# Patient Record
Sex: Male | Born: 1935 | Race: White | Hispanic: No | Smoking: Never smoker
Health system: Southern US, Community
[De-identification: ages and names within clinical notes are randomized; demographics above are authoritative.]

## PROBLEM LIST (undated history)

## (undated) DIAGNOSIS — N400 Enlarged prostate without lower urinary tract symptoms: Secondary | ICD-10-CM

## (undated) DIAGNOSIS — M810 Age-related osteoporosis without current pathological fracture: Secondary | ICD-10-CM

## (undated) DIAGNOSIS — D509 Iron deficiency anemia, unspecified: Secondary | ICD-10-CM

## (undated) DIAGNOSIS — E119 Type 2 diabetes mellitus without complications: Secondary | ICD-10-CM

## (undated) DIAGNOSIS — F32A Depression, unspecified: Secondary | ICD-10-CM

## (undated) DIAGNOSIS — I639 Cerebral infarction, unspecified: Secondary | ICD-10-CM

## (undated) DIAGNOSIS — K5900 Constipation, unspecified: Secondary | ICD-10-CM

## (undated) DIAGNOSIS — N189 Chronic kidney disease, unspecified: Secondary | ICD-10-CM

## (undated) DIAGNOSIS — F329 Major depressive disorder, single episode, unspecified: Secondary | ICD-10-CM

## (undated) DIAGNOSIS — I10 Essential (primary) hypertension: Secondary | ICD-10-CM

## (undated) DIAGNOSIS — K219 Gastro-esophageal reflux disease without esophagitis: Secondary | ICD-10-CM

## (undated) DIAGNOSIS — R609 Edema, unspecified: Secondary | ICD-10-CM

## (undated) DIAGNOSIS — E876 Hypokalemia: Secondary | ICD-10-CM

## (undated) DIAGNOSIS — R1310 Dysphagia, unspecified: Secondary | ICD-10-CM

## (undated) DIAGNOSIS — N390 Urinary tract infection, site not specified: Secondary | ICD-10-CM

## (undated) DIAGNOSIS — L8990 Pressure ulcer of unspecified site, unspecified stage: Secondary | ICD-10-CM

## (undated) HISTORY — PX: SUPRAPUBIC CATHETER INSERTION: SUR719

---

## 2016-11-11 DIAGNOSIS — E119 Type 2 diabetes mellitus without complications: Secondary | ICD-10-CM | POA: Insufficient documentation

## 2016-11-11 DIAGNOSIS — N189 Chronic kidney disease, unspecified: Secondary | ICD-10-CM | POA: Insufficient documentation

## 2016-11-11 DIAGNOSIS — Z794 Long term (current) use of insulin: Secondary | ICD-10-CM | POA: Insufficient documentation

## 2016-11-11 DIAGNOSIS — D72820 Lymphocytosis (symptomatic): Secondary | ICD-10-CM | POA: Insufficient documentation

## 2017-06-08 ENCOUNTER — Other Ambulatory Visit: Payer: Self-pay

## 2017-06-08 ENCOUNTER — Inpatient Hospital Stay
Admission: EM | Admit: 2017-06-08 | Discharge: 2017-06-12 | DRG: 698 | Disposition: A | Discharge status: Skilled Nursing Facility | Payer: Medicare Other | Attending: Specialist | Admitting: Specialist

## 2017-06-08 ENCOUNTER — Inpatient Hospital Stay: Payer: Medicare Other

## 2017-06-08 DIAGNOSIS — N183 Chronic kidney disease, stage 3 (moderate): Secondary | ICD-10-CM | POA: Diagnosis present

## 2017-06-08 DIAGNOSIS — M810 Age-related osteoporosis without current pathological fracture: Secondary | ICD-10-CM | POA: Diagnosis present

## 2017-06-08 DIAGNOSIS — Z7982 Long term (current) use of aspirin: Secondary | ICD-10-CM

## 2017-06-08 DIAGNOSIS — R338 Other retention of urine: Secondary | ICD-10-CM | POA: Diagnosis present

## 2017-06-08 DIAGNOSIS — F0390 Unspecified dementia without behavioral disturbance: Secondary | ICD-10-CM | POA: Diagnosis present

## 2017-06-08 DIAGNOSIS — R532 Functional quadriplegia: Secondary | ICD-10-CM | POA: Diagnosis present

## 2017-06-08 DIAGNOSIS — L89612 Pressure ulcer of right heel, stage 2: Secondary | ICD-10-CM | POA: Diagnosis present

## 2017-06-08 DIAGNOSIS — Y846 Urinary catheterization as the cause of abnormal reaction of the patient, or of later complication, without mention of misadventure at the time of the procedure: Secondary | ICD-10-CM | POA: Diagnosis present

## 2017-06-08 DIAGNOSIS — E1122 Type 2 diabetes mellitus with diabetic chronic kidney disease: Secondary | ICD-10-CM | POA: Diagnosis present

## 2017-06-08 DIAGNOSIS — Z79899 Other long term (current) drug therapy: Secondary | ICD-10-CM

## 2017-06-08 DIAGNOSIS — Z1624 Resistance to multiple antibiotics: Secondary | ICD-10-CM | POA: Diagnosis present

## 2017-06-08 DIAGNOSIS — B961 Klebsiella pneumoniae [K. pneumoniae] as the cause of diseases classified elsewhere: Secondary | ICD-10-CM | POA: Diagnosis present

## 2017-06-08 DIAGNOSIS — Z8673 Personal history of transient ischemic attack (TIA), and cerebral infarction without residual deficits: Secondary | ICD-10-CM

## 2017-06-08 DIAGNOSIS — Z794 Long term (current) use of insulin: Secondary | ICD-10-CM

## 2017-06-08 DIAGNOSIS — Z87440 Personal history of urinary (tract) infections: Secondary | ICD-10-CM

## 2017-06-08 DIAGNOSIS — I129 Hypertensive chronic kidney disease with stage 1 through stage 4 chronic kidney disease, or unspecified chronic kidney disease: Secondary | ICD-10-CM | POA: Diagnosis present

## 2017-06-08 DIAGNOSIS — N179 Acute kidney failure, unspecified: Secondary | ICD-10-CM | POA: Diagnosis present

## 2017-06-08 DIAGNOSIS — N401 Enlarged prostate with lower urinary tract symptoms: Secondary | ICD-10-CM | POA: Diagnosis present

## 2017-06-08 DIAGNOSIS — L89152 Pressure ulcer of sacral region, stage 2: Secondary | ICD-10-CM | POA: Diagnosis present

## 2017-06-08 DIAGNOSIS — Z8249 Family history of ischemic heart disease and other diseases of the circulatory system: Secondary | ICD-10-CM

## 2017-06-08 DIAGNOSIS — T83511A Infection and inflammatory reaction due to indwelling urethral catheter, initial encounter: Secondary | ICD-10-CM | POA: Diagnosis present

## 2017-06-08 DIAGNOSIS — L8990 Pressure ulcer of unspecified site, unspecified stage: Secondary | ICD-10-CM

## 2017-06-08 DIAGNOSIS — N390 Urinary tract infection, site not specified: Secondary | ICD-10-CM | POA: Diagnosis present

## 2017-06-08 DIAGNOSIS — I959 Hypotension, unspecified: Secondary | ICD-10-CM | POA: Diagnosis present

## 2017-06-08 DIAGNOSIS — Z8619 Personal history of other infectious and parasitic diseases: Secondary | ICD-10-CM

## 2017-06-08 DIAGNOSIS — L89622 Pressure ulcer of left heel, stage 2: Secondary | ICD-10-CM | POA: Diagnosis present

## 2017-06-08 DIAGNOSIS — K219 Gastro-esophageal reflux disease without esophagitis: Secondary | ICD-10-CM | POA: Diagnosis present

## 2017-06-08 DIAGNOSIS — R05 Cough: Secondary | ICD-10-CM | POA: Diagnosis not present

## 2017-06-08 DIAGNOSIS — Z66 Do not resuscitate: Secondary | ICD-10-CM | POA: Diagnosis present

## 2017-06-08 DIAGNOSIS — R059 Cough, unspecified: Secondary | ICD-10-CM

## 2017-06-08 DIAGNOSIS — Z1612 Extended spectrum beta lactamase (ESBL) resistance: Secondary | ICD-10-CM | POA: Diagnosis present

## 2017-06-08 DIAGNOSIS — R310 Gross hematuria: Secondary | ICD-10-CM | POA: Diagnosis present

## 2017-06-08 DIAGNOSIS — E860 Dehydration: Secondary | ICD-10-CM | POA: Diagnosis present

## 2017-06-08 HISTORY — DX: Benign prostatic hyperplasia without lower urinary tract symptoms: N40.0

## 2017-06-08 HISTORY — DX: Type 2 diabetes mellitus without complications: E11.9

## 2017-06-08 HISTORY — DX: Gastro-esophageal reflux disease without esophagitis: K21.9

## 2017-06-08 HISTORY — DX: Depression, unspecified: F32.A

## 2017-06-08 HISTORY — DX: Age-related osteoporosis without current pathological fracture: M81.0

## 2017-06-08 HISTORY — DX: Major depressive disorder, single episode, unspecified: F32.9

## 2017-06-08 LAB — BASIC METABOLIC PANEL
Anion gap: 9 (ref 5–15)
BUN: 48 mg/dL — ABNORMAL HIGH (ref 6–20)
CALCIUM: 8 mg/dL — AB (ref 8.9–10.3)
CO2: 26 mmol/L (ref 22–32)
CREATININE: 2.02 mg/dL — AB (ref 0.61–1.24)
Chloride: 100 mmol/L — ABNORMAL LOW (ref 101–111)
GFR calc non Af Amer: 28 mL/min — ABNORMAL LOW (ref >60)
GFR, EST AFRICAN AMERICAN: 33 mL/min — AB (ref >60)
Glucose, Bld: 106 mg/dL — ABNORMAL HIGH (ref 65–99)
Potassium: 4.3 mmol/L (ref 3.5–5.1)
SODIUM: 135 mmol/L (ref 135–145)

## 2017-06-08 LAB — URINALYSIS, COMPLETE (UACMP) WITH MICROSCOPIC
Bilirubin Urine: NEGATIVE
GLUCOSE, UA: NEGATIVE mg/dL
KETONES UR: NEGATIVE mg/dL
NITRITE: POSITIVE — AB
PH: 5 (ref 5.0–8.0)
Protein, ur: 100 mg/dL — AB
SPECIFIC GRAVITY, URINE: 1.009 (ref 1.005–1.030)

## 2017-06-08 LAB — CBC WITH DIFFERENTIAL/PLATELET
BASOS PCT: 0 %
Basophils Absolute: 0.1 10*3/uL (ref 0–0.1)
EOS ABS: 0.2 10*3/uL (ref 0–0.7)
Eosinophils Relative: 2 %
HCT: 32.7 % — ABNORMAL LOW (ref 40.0–52.0)
Hemoglobin: 10.8 g/dL — ABNORMAL LOW (ref 13.0–18.0)
Lymphocytes Relative: 29 %
Lymphs Abs: 3.5 10*3/uL (ref 1.0–3.6)
MCH: 28.2 pg (ref 26.0–34.0)
MCHC: 32.9 g/dL (ref 32.0–36.0)
MCV: 85.8 fL (ref 80.0–100.0)
Monocytes Absolute: 1.1 10*3/uL — ABNORMAL HIGH (ref 0.2–1.0)
Monocytes Relative: 9 %
Neutro Abs: 7.3 10*3/uL — ABNORMAL HIGH (ref 1.4–6.5)
Neutrophils Relative %: 60 %
Platelets: 216 10*3/uL (ref 150–440)
RBC: 3.82 MIL/uL — AB (ref 4.40–5.90)
RDW: 17.9 % — ABNORMAL HIGH (ref 11.5–14.5)
WBC: 12.2 10*3/uL — AB (ref 3.8–10.6)

## 2017-06-08 LAB — PROTIME-INR
INR: 0.99
PROTHROMBIN TIME: 13 s (ref 11.4–15.2)

## 2017-06-08 LAB — GLUCOSE, CAPILLARY: GLUCOSE-CAPILLARY: 87 mg/dL (ref 65–99)

## 2017-06-08 LAB — CK: Total CK: 30 U/L — ABNORMAL LOW (ref 49–397)

## 2017-06-08 MED ORDER — ACETAMINOPHEN 650 MG RE SUPP: 650.0000 mg | Freq: Four times a day (QID) | RECTAL | Status: DC | PRN

## 2017-06-08 MED ORDER — FERROUS SULFATE 325 (65 FE) MG PO TABS
325.0000 mg | ORAL_TABLET | ORAL | Status: DC
  Administered 2017-06-09 – 2017-06-12 (×4): 325 mg via ORAL
  Filled 2017-06-08 (×4): qty 1

## 2017-06-08 MED ORDER — CHOLECALCIFEROL 10 MCG (400 UNIT) PO TABS
400.0000 [IU] | ORAL_TABLET | Freq: Three times a day (TID) | ORAL | Status: DC
  Administered 2017-06-08 – 2017-06-12 (×11): 400 [IU] via ORAL
  Filled 2017-06-08 (×14): qty 1

## 2017-06-08 MED ORDER — TRAZODONE HCL 50 MG PO TABS
50.0000 mg | ORAL_TABLET | Freq: Every evening | ORAL | Status: DC
  Administered 2017-06-08 – 2017-06-11 (×4): 50 mg via ORAL
  Filled 2017-06-08 (×4): qty 1

## 2017-06-08 MED ORDER — SODIUM CHLORIDE 0.9 % IV SOLN
1.0000 g | Freq: Two times a day (BID) | INTRAVENOUS | Status: DC
Start: 2017-06-08 — End: 2017-06-11
  Administered 2017-06-08 – 2017-06-11 (×6): 1 g via INTRAVENOUS
  Filled 2017-06-08 (×7): qty 1

## 2017-06-08 MED ORDER — HYDROCODONE-ACETAMINOPHEN 5-325 MG PO TABS
1.0000 | ORAL_TABLET | ORAL | Status: DC | PRN
  Administered 2017-06-09 – 2017-06-10 (×3): 1 via ORAL
  Filled 2017-06-08 (×3): qty 1

## 2017-06-08 MED ORDER — POLYETHYLENE GLYCOL 3350 17 G PO PACK: 17.0000 g | PACK | Freq: Every day | ORAL | Status: DC | PRN

## 2017-06-08 MED ORDER — QUETIAPINE FUMARATE 25 MG PO TABS
12.5000 mg | ORAL_TABLET | Freq: Two times a day (BID) | ORAL | Status: DC
  Administered 2017-06-08 – 2017-06-12 (×8): 12.5 mg via ORAL
  Filled 2017-06-08 (×8): qty 1

## 2017-06-08 MED ORDER — GUAIFENESIN 100 MG/5ML PO SOLN
10.0000 mL | ORAL | Status: DC | PRN
  Filled 2017-06-08: qty 10

## 2017-06-08 MED ORDER — METOPROLOL SUCCINATE ER 50 MG PO TB24
50.0000 mg | ORAL_TABLET | Freq: Every day | ORAL | Status: DC
  Administered 2017-06-10: 50 mg via ORAL
  Filled 2017-06-08 (×2): qty 1

## 2017-06-08 MED ORDER — SODIUM CHLORIDE 0.9 % IV SOLN
INTRAVENOUS | Status: DC
  Administered 2017-06-08 – 2017-06-11 (×4): via INTRAVENOUS

## 2017-06-08 MED ORDER — SODIUM CHLORIDE 0.9 % IV SOLN: 500.0000 mg | Freq: Three times a day (TID) | INTRAVENOUS | Status: DC

## 2017-06-08 MED ORDER — LACTATED RINGERS IV SOLN: INTRAVENOUS | Status: DC

## 2017-06-08 MED ORDER — PIPERACILLIN-TAZOBACTAM 3.375 G IVPB 30 MIN
3.3750 g | Freq: Once | INTRAVENOUS | Status: AC
  Administered 2017-06-08: 3.375 g via INTRAVENOUS
  Filled 2017-06-08: qty 50

## 2017-06-08 MED ORDER — BISACODYL 10 MG RE SUPP
10.0000 mg | Freq: Every day | RECTAL | Status: DC | PRN
Start: 2017-06-08 — End: 2017-06-12

## 2017-06-08 MED ORDER — PANTOPRAZOLE SODIUM 40 MG PO TBEC
40.0000 mg | DELAYED_RELEASE_TABLET | ORAL | Status: DC
  Administered 2017-06-09 – 2017-06-12 (×4): 40 mg via ORAL
  Filled 2017-06-08 (×4): qty 1

## 2017-06-08 MED ORDER — POTASSIUM CHLORIDE CRYS ER 10 MEQ PO TBCR
10.0000 meq | EXTENDED_RELEASE_TABLET | Freq: Every day | ORAL | Status: DC
  Administered 2017-06-09 – 2017-06-12 (×4): 10 meq via ORAL
  Filled 2017-06-08 (×4): qty 1

## 2017-06-08 MED ORDER — ACETAMINOPHEN 325 MG PO TABS: 650.0000 mg | ORAL_TABLET | Freq: Four times a day (QID) | ORAL | Status: DC | PRN

## 2017-06-08 MED ORDER — ONDANSETRON HCL 4 MG PO TABS: 4.0000 mg | ORAL_TABLET | Freq: Four times a day (QID) | ORAL | Status: DC | PRN

## 2017-06-08 MED ORDER — ONDANSETRON HCL 4 MG/2ML IJ SOLN: 4.0000 mg | Freq: Four times a day (QID) | INTRAMUSCULAR | Status: DC | PRN

## 2017-06-08 MED ORDER — ASPIRIN 81 MG PO CHEW
81.0000 mg | CHEWABLE_TABLET | Freq: Every day | ORAL | Status: DC
  Administered 2017-06-09: 81 mg via ORAL
  Filled 2017-06-08 (×2): qty 1

## 2017-06-08 MED ORDER — INSULIN GLARGINE 100 UNIT/ML ~~LOC~~ SOLN
20.0000 [IU] | Freq: Every day | SUBCUTANEOUS | Status: DC
  Administered 2017-06-09 – 2017-06-10 (×2): 20 [IU] via SUBCUTANEOUS
  Filled 2017-06-08 (×4): qty 0.2

## 2017-06-08 MED ORDER — FLEET ENEMA 7-19 GM/118ML RE ENEM: 1.0000 | ENEMA | Freq: Once | RECTAL | Status: DC | PRN

## 2017-06-08 MED ORDER — TAMSULOSIN HCL 0.4 MG PO CAPS
0.4000 mg | ORAL_CAPSULE | ORAL | Status: DC
  Administered 2017-06-09 – 2017-06-12 (×4): 0.4 mg via ORAL
  Filled 2017-06-08 (×4): qty 1

## 2017-06-08 MED ORDER — DOCUSATE SODIUM 100 MG PO CAPS
100.0000 mg | ORAL_CAPSULE | Freq: Two times a day (BID) | ORAL | Status: DC
  Administered 2017-06-08 – 2017-06-12 (×8): 100 mg via ORAL
  Filled 2017-06-08 (×8): qty 1

## 2017-06-08 MED ORDER — CITALOPRAM HYDROBROMIDE 20 MG PO TABS
20.0000 mg | ORAL_TABLET | Freq: Every day | ORAL | Status: DC
  Administered 2017-06-09 – 2017-06-12 (×4): 20 mg via ORAL
  Filled 2017-06-08 (×4): qty 1

## 2017-06-08 MED ORDER — SODIUM CHLORIDE 0.9 % IV BOLUS
500.0000 mL | Freq: Once | INTRAVENOUS | Status: AC
  Administered 2017-06-08: 500 mL via INTRAVENOUS

## 2017-06-08 MED ORDER — CALCIUM CARBONATE ANTACID 500 MG PO CHEW
1.0000 | CHEWABLE_TABLET | Freq: Every day | ORAL | Status: DC
  Administered 2017-06-09 – 2017-06-12 (×4): 200 mg via ORAL
  Filled 2017-06-08 (×4): qty 1

## 2017-06-08 MED ORDER — DUTASTERIDE 0.5 MG PO CAPS
0.5000 mg | ORAL_CAPSULE | Freq: Every day | ORAL | Status: DC
  Administered 2017-06-09 – 2017-06-12 (×4): 0.5 mg via ORAL
  Filled 2017-06-08 (×4): qty 1

## 2017-06-08 NOTE — ED Provider Notes (Signed)
[facility] Emergency Department Provider Note ____________________________________________   First MD Initiated Contact with Patient 06/08/17 1610     (approximate)  I have reviewed the triage vital signs and the nursing notes.   HISTORY  Chief Complaint Hematuria  Level 5 caveat: History of present illness limited due to dementia  HPI Dustin Hicks is a 84 y.o. male with PMH as noted below who presents with apparent hematuria, acute onset over the last several days, with no significant associated symptoms.  The daughter states that the patient previously had dark/cloudy urine, and was treated with Augmentin for 2 weeks, finishing a few days ago.  She states that when she went to the nursing facility that she noticed the dark urine.  She states that his mental status is at his baseline, and the patient himself denies any acute symptoms.  The daughter also reports that the patient has pressure sores on bilateral heels and sacral area which are being addressed at the nursing home, but she is concerned that they might not be being cared for appropriately.   History reviewed. No pertinent past medical history.  There are no active problems to display for this patient.   History reviewed. No pertinent surgical history.  Prior to Admission medications   Medication Sig Start Date End Date Taking? Authorizing Provider  acetaminophen (TYLENOL) 325 MG tablet Take 650 mg by mouth every 4 (four) hours as needed for mild pain or fever.   Yes [provider]  acetaminophen (TYLENOL) 500 MG tablet Take 1,000 mg by mouth 3 (three) times daily.   Yes [provider]  aspirin 81 MG chewable tablet Chew 81 mg by mouth daily.   Yes [provider]  calcium carbonate (TUMS - DOSED IN MG ELEMENTAL CALCIUM) 500 MG chewable tablet Chew 1 tablet by mouth daily.   Yes [provider]  cholecalciferol (VITAMIN D) 400 units TABS tablet Take  400 Units by mouth 3 (three) times daily.   Yes [provider]  citalopram (CELEXA) 20 MG tablet Take 20 mg by mouth daily.   Yes [provider]  dutasteride (AVODART) 0.5 MG capsule Take 0.5 mg by mouth daily.   Yes [provider]  ferrous sulfate 325 (65 FE) MG tablet Take 325 mg by mouth every morning.   Yes [provider]  furosemide (LASIX) 20 MG tablet Take 20 mg by mouth every morning.   Yes [provider]  guaiFENesin (ROBITUSSIN) 100 MG/5ML SOLN Take 10 mLs by mouth every 4 (four) hours as needed for cough or to loosen phlegm.   Yes [provider]  insulin glargine (LANTUS) 100 UNIT/ML injection Inject 20 Units into the skin at bedtime.   Yes [provider]  metoprolol succinate (TOPROL-XL) 50 MG 24 hr tablet Take 50 mg by mouth daily. Take with or immediately following a meal.   Yes [provider]  pantoprazole (PROTONIX) 40 MG tablet Take 40 mg by mouth every morning.   Yes [provider]  potassium chloride (K-DUR) 10 MEQ tablet Take 10 mEq by mouth daily.   Yes [provider]  QUEtiapine (SEROQUEL) 25 MG tablet Take 12.5 mg by mouth 2 (two) times daily.   Yes [provider]  tamsulosin (FLOMAX) 0.4 MG CAPS capsule Take 0.4 mg by mouth every morning.   Yes [provider]  traMADol (ULTRAM) 50 MG tablet Take 50 mg by mouth 3 (three) times daily as needed for moderate pain.  Yes [provider]  traZODone (DESYREL) 50 MG tablet Take 50 mg by mouth every evening.   Yes [provider]    Allergies Patient has no known allergies.  No family history on file.  Social History Social History   Tobacco Use  . Smoking status: Never Smoker  . Smokeless tobacco: Never Used  Substance Use Topics  . Alcohol use: Never    Frequency: Never  . Drug use: Never    Review of Systems Level 5 caveat: Unable to obtain review of systems due to  dementia    ____________________________________________   PHYSICAL EXAM:  VITAL SIGNS: ED Triage Vitals  Enc Vitals Group     BP 06/08/17 1548 108/60     Pulse Rate 06/08/17 1548 60     Resp 06/08/17 1548 15     Temp 06/08/17 1548 97.8 F (36.6 C)     Temp Source 06/08/17 1548 Axillary     SpO2 06/08/17 1548 98 %     Weight 06/08/17 1549 154 lb (69.9 kg)     Height 06/08/17 1549 5\' 10"  (1.778 m)     Head Circumference --      Peak Flow --      Pain Score --      Pain Loc --      Pain Edu? --      Excl. in GC? --     Constitutional: Alert, oriented x2.  Comfortable appearing. Eyes: Conjunctivae are normal.  Head: Atraumatic. Nose: No congestion/rhinnorhea. Mouth/Throat: Mucous membranes are dry.   Neck: Normal range of motion.  Cardiovascular: Normal rate, regular rhythm. Grossly normal heart sounds.  Good peripheral circulation. Respiratory: Normal respiratory effort.  No retractions. Lungs CTAB. Gastrointestinal: Soft and nontender. No distention.  Genitourinary: No flank tenderness. Musculoskeletal: No lower extremity edema.  Extremities warm and well perfused.  Neurologic:  Normal speech and language. No gross focal neurologic deficits are appreciated.  Skin:  Skin is warm and dry. No rash noted. Psychiatric: Mood and affect are normal. Speech and behavior are normal.  ____________________________________________   LABS (all labs ordered are listed, but only abnormal results are displayed)  Labs Reviewed  BASIC METABOLIC PANEL - Abnormal; Notable for the following components:      Result Value   Chloride 100 (*)    Glucose, Bld 106 (*)    BUN 48 (*)    Creatinine, Ser 2.02 (*)    Calcium 8.0 (*)    GFR calc non Af Amer 28 (*)    GFR calc Af Amer 33 (*)    All other components within normal limits  CBC WITH DIFFERENTIAL/PLATELET - Abnormal; Notable for the following components:   WBC 12.2 (*)    RBC 3.82 (*)    Hemoglobin 10.8 (*)    HCT 32.7 (*)     RDW 17.9 (*)    Neutro Abs 7.3 (*)    Monocytes Absolute 1.1 (*)    All other components within normal limits  URINALYSIS, COMPLETE (UACMP) WITH MICROSCOPIC - Abnormal; Notable for the following components:   Color, Urine AMBER (*)    APPearance TURBID (*)    Hgb urine dipstick LARGE (*)    Protein, ur 100 (*)    Nitrite POSITIVE (*)    Leukocytes, UA LARGE (*)    Bacteria, UA MANY (*)    All other components within normal limits  CK - Abnormal; Notable for the following components:   Total CK 30 (*)  All other components within normal limits  URINE CULTURE  PROTIME-INR   ____________________________________________  EKG  ____________________________________________  RADIOLOGY    ____________________________________________   PROCEDURES  Procedure(s) performed: No  Procedures  Critical Care performed: No ____________________________________________   INITIAL IMPRESSION / ASSESSMENT AND PLAN / ED COURSE  Pertinent labs & imaging results that were available during my care of the patient were reviewed by me and considered in my medical decision making (see chart for details).  84 year old male with PMH as noted above and status post recent treatment for apparent UTI presents with recurrent dark urine, noticed today.  The patient's daughter also reports that he has sacral and heel pressure sores which she was concerned were not being adequately treated at the nursing facility, although she says that they have improved over the last few weeks.  On exam, the patient is comfortable appearing and alert.  Vital signs are normal.  The remainder the exam is as described above.  The urine in the bag is a dark brown color.  Differential includes UTI, hematuria from other source, dehydration or other metabolic abnormality, or rhabdomyolysis.  Plan: IV fluids, lab work-up, UA, and reassess.    ----------------------------------------- 7:24 PM on  06/08/2017 -----------------------------------------  Patient's UA is grossly abnormal with WBCs and RBCs, nitrates, and leukocyte esterase.  Although some of this may be due to chronic colonization related to his catheter, given the acute change in the appearance of his urine as well as his elevated RBC count this is more consistent with resistant infection.  Per the daughter, the patient had not even finished the current antibiotic course when the urine became cloudy and dark over the last few days.  Unfortunately, although the patient follows at [facility], his [facility] records are not crossing over via care everywhere, and I am not able to obtain his past records.  Per the daughter, his most recent creatinine was either 1.6-1.9, so his creatinine today is likely not a significant change.  Daughter states that there may been a history of ESBL.  I have given a dose of broad-spectrum IV antibiotic.  Given the concern for possible resistant UTI status post multiple courses of outpatient oral treatment, I feel that it would be safest to admit the patient for IV antibiotics until his cultures can result.  Daughter agrees with this plan.  I signed the patient out for admission to the hospitalist Dr. Katheren Shams.  ____________________________________________   FINAL CLINICAL IMPRESSION(S) / ED DIAGNOSES  Final diagnoses:  Lower urinary tract infectious disease      NEW MEDICATIONS STARTED DURING THIS VISIT:  New Prescriptions   No medications on file     Note:  This document was prepared using Dragon voice recognition software and may include unintentional dictation errors.    Dionne Bucy, MD 06/08/17 [phone]

## 2017-06-08 NOTE — Progress Notes (Signed)
Pharmacy Antibiotic Note  Dustin Hicks is a 84 y.o. male admitted on 06/08/2017 with esbl uti.  Pharmacy has been consulted for merrem dosing.  Plan: merrem 1gm iv q12h   Height: 5\' 10"  (177.8 cm) Weight: 154 lb (69.9 kg) IBW/kg (Calculated) : 73  Temp (24hrs), Avg:97.8 F (36.6 C), Min:97.8 F (36.6 C), Max:97.8 F (36.6 C)  Recent Labs  Lab 06/08/17 1633  WBC 12.2*  CREATININE 2.02*    Estimated Creatinine Clearance: 26 mL/min (A) (by C-G formula based on SCr of 2.02 mg/dL (H)).    No Known Allergies  Antimicrobials this admission: Anti-infectives (From admission, onward)   Start     Dose/Rate Route Frequency Ordered Stop   06/08/17 2200  meropenem (MERREM) 1 g in sodium chloride 0.9 % 100 mL IVPB     1 g 200 mL/hr over 30 Minutes Intravenous Every 12 hours 06/08/17 1959     06/08/17 1915  piperacillin-tazobactam (ZOSYN) IVPB 3.375 g     3.375 g 100 mL/hr over 30 Minutes Intravenous  Once 06/08/17 1911         Microbiology results: No results found for this or any previous visit (from the past 240 hour(s)).   Thank you for allowing pharmacy to be a part of this patient's care.  Gerre PebblesGarrett Cyndee Giammarco 06/08/2017 7:59 PM

## 2017-06-08 NOTE — H&P (Addendum)
[facility] [place] [facility]   PATIENT NAME: Dustin Hicks    MR#:  [id number]  DATE OF BIRTH:  Sep 26, 1928  DATE OF ADMISSION:  06/08/2017  PRIMARY CARE PHYSICIAN: Patient, No Pcp Per   REQUESTING/REFERRING PHYSICIAN:   CHIEF COMPLAINT:   Chief Complaint  Patient presents with  . Hematuria    HISTORY OF PRESENT ILLNESS: Erian Rosengren  is a 84 y.o. male with a known history of ESBL complicated urinary tract infection due to chronic indwelling Foley, recently completed 15-day course of Augmentin for UTI-culture results unknown, known history of BPH, hypertension, GERD, CVA, diabetes mellitus type 2, presenting from local nursing home via EMS for concern for recurrent UTI as noted by daughter, patient was noted to have some blood in the urine, patient also with bilateral heel and ulcer wounds that now developed per daughter, in the emergency room patient resting comfortably in bed, no apparent distress, poor historian, ER workup noted for creatinine of 2-baseline unknown, BUN 48, white count 12,000, urinalysis consistent with UTI, patient is now being admitted for acute complicated UTI due to chronic indwelling Foley, acute kidney injury, and dehydration  PAST MEDICAL HISTORY:   See HPI  PAST SURGICAL HISTORY:  Chronic indwelling Foley placement   SOCIAL HISTORY:  Social History   Tobacco Use  . Smoking status: Never Smoker  . Smokeless tobacco: Never Used  Substance Use Topics  . Alcohol use: Never    Frequency: Never    FAMILY HISTORY:  Hypertension  DRUG ALLERGIES: No Known Allergies  REVIEW OF SYSTEMS:  Unable to obtain due to poor historian  MEDICATIONS AT HOME:  Prior to Admission medications   Medication Sig Start Date End Date Taking? Authorizing Provider  acetaminophen (TYLENOL) 325 MG tablet Take 650 mg by mouth every 4 (four) hours as needed for mild pain or fever.   Yes [provider]  acetaminophen (TYLENOL) 500 MG tablet  Take 1,000 mg by mouth 3 (three) times daily.   Yes [provider]  aspirin 81 MG chewable tablet Chew 81 mg by mouth daily.   Yes [provider]  calcium carbonate (TUMS - DOSED IN MG ELEMENTAL CALCIUM) 500 MG chewable tablet Chew 1 tablet by mouth daily.   Yes [provider]  cholecalciferol (VITAMIN D) 400 units TABS tablet Take 400 Units by mouth 3 (three) times daily.   Yes [provider]  citalopram (CELEXA) 20 MG tablet Take 20 mg by mouth daily.   Yes [provider]  dutasteride (AVODART) 0.5 MG capsule Take 0.5 mg by mouth daily.   Yes [provider]  ferrous sulfate 325 (65 FE) MG tablet Take 325 mg by mouth every morning.   Yes [provider]  furosemide (LASIX) 20 MG tablet Take 20 mg by mouth every morning.   Yes [provider]  guaiFENesin (ROBITUSSIN) 100 MG/5ML SOLN Take 10 mLs by mouth every 4 (four) hours as needed for cough or to loosen phlegm.   Yes [provider]  insulin glargine (LANTUS) 100 UNIT/ML injection Inject 20 Units into the skin at bedtime.   Yes [provider]  metoprolol succinate (TOPROL-XL) 50 MG 24 hr tablet Take 50 mg by mouth daily. Take with or immediately following a meal.   Yes [provider]  pantoprazole (PROTONIX) 40 MG tablet Take 40 mg by mouth every morning.   Yes [provider]  potassium chloride (K-DUR) 10 MEQ tablet Take 10 mEq by mouth  daily.   Yes [provider]  QUEtiapine (SEROQUEL) 25 MG tablet Take 12.5 mg by mouth 2 (two) times daily.   Yes [provider]  tamsulosin (FLOMAX) 0.4 MG CAPS capsule Take 0.4 mg by mouth every morning.   Yes [provider]  traMADol (ULTRAM) 50 MG tablet Take 50 mg by mouth 3 (three) times daily as needed for moderate pain.   Yes [provider]  traZODone (DESYREL) 50 MG tablet Take 50 mg by mouth every evening.   Yes [provider]       PHYSICAL EXAMINATION:   VITAL SIGNS: Blood pressure 131/73, pulse 70, temperature 97.8 F (36.6 C), temperature source Axillary, resp. rate 15, height 5\' 10"  (1.778 m), weight 69.9 kg (154 lb), SpO2 99 %.  GENERAL:  84 y.o.-year-old patient lying in the bed with no acute distress.  Frail-appearing EYES: Pupils equal, round, reactive to light and accommodation. No scleral icterus. Extraocular muscles intact.  HEENT: Head atraumatic, normocephalic. Oropharynx and nasopharynx clear.  NECK:  Supple, no jugular venous distention. No thyroid enlargement, no tenderness.  LUNGS: Normal breath sounds bilaterally, no wheezing, rales,rhonchi or crepitation. No use of accessory muscles of respiration.  CARDIOVASCULAR: S1, S2 normal. No murmurs, rubs, or gallops.  ABDOMEN: Soft, nontender, nondistended. Bowel sounds present. No organomegaly or mass.  Chronic Foley placement noted EXTREMITIES: No pedal edema, cyanosis, or clubbing.  NEUROLOGIC: Cranial nerves II through XII are intact. MAES. Gait not checked.  PSYCHIATRIC: The patient is awake, alert, oriented x1-2 SKIN: No obvious rash, lesion, or ulcer.   LABORATORY PANEL:   CBC Recent Labs  Lab 06/08/17 1633  WBC 12.2*  HGB 10.8*  HCT 32.7*  PLT 216  MCV 85.8  MCH 28.2  MCHC 32.9  RDW 17.9*  LYMPHSABS 3.5  MONOABS 1.1*  EOSABS 0.2  BASOSABS 0.1   ------------------------------------------------------------------------------------------------------------------  Chemistries  Recent Labs  Lab 06/08/17 1633  NA 135  K 4.3  CL 100*  CO2 26  GLUCOSE 106*  BUN 48*  CREATININE 2.02*  CALCIUM 8.0*   ------------------------------------------------------------------------------------------------------------------ estimated creatinine clearance is 26 mL/min (A) (by C-G formula based on SCr of 2.02 mg/dL (H)). ------------------------------------------------------------------------------------------------------------------ No  results for input(s): TSH, T4TOTAL, T3FREE, THYROIDAB in the last 72 hours.  Invalid input(s): FREET3   Coagulation profile Recent Labs  Lab 06/08/17 1633  INR 0.99   ------------------------------------------------------------------------------------------------------------------- No results for input(s): DDIMER in the last 72 hours. -------------------------------------------------------------------------------------------------------------------  Cardiac Enzymes No results for input(s): CKMB, TROPONINI, MYOGLOBIN in the last 168 hours.  Invalid input(s): CK ------------------------------------------------------------------------------------------------------------------ Invalid input(s): POCBNP  ---------------------------------------------------------------------------------------------------------------  Urinalysis    Component Value Date/Time   COLORURINE AMBER (A) 06/08/2017 1633   APPEARANCEUR TURBID (A) 06/08/2017 1633   LABSPEC 1.009 06/08/2017 1633   PHURINE 5.0 06/08/2017 1633   GLUCOSEU NEGATIVE 06/08/2017 1633   HGBUR LARGE (A) 06/08/2017 1633   BILIRUBINUR NEGATIVE 06/08/2017 1633   KETONESUR NEGATIVE 06/08/2017 1633   PROTEINUR 100 (A) 06/08/2017 1633   NITRITE POSITIVE (A) 06/08/2017 1633   LEUKOCYTESUR LARGE (A) 06/08/2017 1633     RADIOLOGY: No results found.  EKG: No orders found for this or any previous visit.  IMPRESSION AND PLAN: *Acute recurrent complicated urinary tract infection due to chronic indwelling Foley Completed 15-day course of Augmentin a few days ago per daughter, history of ESBL infection Admit to regular nursing for bed, empiric meropenem for 5-day course, follow-up on cultures, change Foley system if not already done in emergency room  *Acute kidney  injury secondary to dehydration/medication side effect Baseline renal function unknown Hold Lasix, IV fluids for rehydration, avoid nephrotoxic agents, BMP in the  morning  *History of CVA Stable-at baseline Continue aspirin, increase nursing care as needed, continue Seroquel, trazodone aspiration/fall precautions while in house  *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 Management plans discussed with the patient, family and they are in agreement.  CODE STATUS:DNR    TOTAL TIME TAKING CARE OF THIS PATIENT: .    Evelena Asa Salary M.D on 06/08/2017   Between 7am to 6pm - Pager - [phone]  After 6pm go to [email or web address] - password [email or web address]  [facility]  Office  [phone]  CC: Primary care physician; Patient, No Pcp Per   Note: This dictation was prepared with Dragon dictation along with smaller phrase technology. Any transcriptional errors that result from this process are unintentional.

## 2017-06-08 NOTE — ED Triage Notes (Signed)
Pt arrived via ems for c/o UTI and blood in urine - pt just finished 15d of Augmentin and now he has blood in urine - daughter is concerned that pt now has pressure areas on bilat heels and sacral area - she feels that [facility] is not caring for her father appropriately

## 2017-06-08 NOTE — Progress Notes (Signed)
Family Meeting Note  Advance Directive:yes  Today a meeting took place with the Patient, patient's daughter.  Patient is unable to participate due [id number]:Lacked capacity confusion   The following clinical team members were present during this meeting:MD  The following were discussed:Patient's diagnosis: CVA history, diabetes, functional quadriplegic, complicated UTI, BPH, chronic indwelling Foley, hypertension, GERD, acute kidney injury, bilateral heel and sacral wounds, Patient's progosis: Unable to determine and Goals for treatment: DNR  Additional follow-up to be provided: prn  Time spent during discussion:20 minutes  Bertrum SolMontell D Shabria Egley, MD

## 2017-06-09 DIAGNOSIS — N390 Urinary tract infection, site not specified: Secondary | ICD-10-CM

## 2017-06-09 DIAGNOSIS — L8990 Pressure ulcer of unspecified site, unspecified stage: Secondary | ICD-10-CM

## 2017-06-09 DIAGNOSIS — R05 Cough: Secondary | ICD-10-CM

## 2017-06-09 LAB — BASIC METABOLIC PANEL
ANION GAP: 6 (ref 5–15)
BUN: 43 mg/dL — ABNORMAL HIGH (ref 6–20)
CALCIUM: 7.9 mg/dL — AB (ref 8.9–10.3)
CO2: 26 mmol/L (ref 22–32)
Chloride: 103 mmol/L (ref 101–111)
Creatinine, Ser: 1.87 mg/dL — ABNORMAL HIGH (ref 0.61–1.24)
GFR, EST AFRICAN AMERICAN: 36 mL/min — AB (ref >60)
GFR, EST NON AFRICAN AMERICAN: 31 mL/min — AB (ref >60)
Glucose, Bld: 79 mg/dL (ref 65–99)
POTASSIUM: 4.3 mmol/L (ref 3.5–5.1)
SODIUM: 135 mmol/L (ref 135–145)

## 2017-06-09 LAB — GLUCOSE, CAPILLARY
GLUCOSE-CAPILLARY: 96 mg/dL (ref 65–99)
Glucose-Capillary: 127 mg/dL — ABNORMAL HIGH (ref 65–99)

## 2017-06-09 LAB — CBC
HCT: 30.1 % — ABNORMAL LOW (ref 40.0–52.0)
Hemoglobin: 9.7 g/dL — ABNORMAL LOW (ref 13.0–18.0)
MCH: 27.6 pg (ref 26.0–34.0)
MCHC: 32.3 g/dL (ref 32.0–36.0)
MCV: 85.4 fL (ref 80.0–100.0)
Platelets: 210 10*3/uL (ref 150–440)
RBC: 3.52 MIL/uL — AB (ref 4.40–5.90)
RDW: 17.1 % — AB (ref 11.5–14.5)
WBC: 12.4 10*3/uL — AB (ref 3.8–10.6)

## 2017-06-09 LAB — MRSA PCR SCREENING: MRSA BY PCR: NEGATIVE

## 2017-06-09 MED ORDER — ZINC OXIDE 40 % EX OINT
TOPICAL_OINTMENT | Freq: Two times a day (BID) | CUTANEOUS | Status: DC
  Administered 2017-06-09 – 2017-06-12 (×7): via TOPICAL
  Filled 2017-06-09: qty 113

## 2017-06-09 MED ORDER — OCUVITE-LUTEIN PO CAPS
1.0000 | ORAL_CAPSULE | Freq: Every day | ORAL | Status: DC
  Administered 2017-06-09: 1 via ORAL
  Filled 2017-06-09 (×3): qty 1

## 2017-06-09 MED ORDER — INSULIN ASPART 100 UNIT/ML ~~LOC~~ SOLN: 0.0000 [IU] | Freq: Every day | SUBCUTANEOUS | Status: DC

## 2017-06-09 MED ORDER — SODIUM CHLORIDE 0.9 % IV BOLUS
500.0000 mL | Freq: Once | INTRAVENOUS | Status: AC
  Administered 2017-06-09: 500 mL via INTRAVENOUS

## 2017-06-09 MED ORDER — ENSURE ENLIVE PO LIQD
237.0000 mL | Freq: Two times a day (BID) | ORAL | Status: DC
  Administered 2017-06-09 – 2017-06-12 (×7): 237 mL via ORAL

## 2017-06-09 MED ORDER — INSULIN ASPART 100 UNIT/ML ~~LOC~~ SOLN
0.0000 [IU] | Freq: Three times a day (TID) | SUBCUTANEOUS | Status: DC
  Administered 2017-06-10: 1 [IU] via SUBCUTANEOUS
  Filled 2017-06-09: qty 1

## 2017-06-09 NOTE — Consult Note (Signed)
Urology Consult  I have been asked to see the patient by Dr. Anne Hahn, for evaluation and management of difficult Foley placement and penile bleeding.  Chief Complaint: n/a  History of Present Illness: Dustin Hicks is a 84 y.o. year old recently admitted for UTI and hematuria.  His daughter was present at the time of this visit and states he was recently placed in skilled nursing at [facility].  He has a several year history of a chronic indwelling Foley although the etiology is unclear.  She states since his placement and skilled nursing he undergoes monthly catheter changes and after each catheter change he has gross hematuria for several days. He recently finished a course of Augmentin for a UTI.  History reviewed. No pertinent past medical history.  History reviewed. No pertinent surgical history.  Home Medications:  Current Meds  Medication Sig  . acetaminophen (TYLENOL) 325 MG tablet Take 650 mg by mouth every 4 (four) hours as needed for mild pain or fever.  Marland Kitchen acetaminophen (TYLENOL) 500 MG tablet Take 1,000 mg by mouth 3 (three) times daily.  Marland Kitchen aspirin 81 MG chewable tablet Chew 81 mg by mouth daily.  . calcium carbonate (TUMS - DOSED IN MG ELEMENTAL CALCIUM) 500 MG chewable tablet Chew 1 tablet by mouth daily.  . cholecalciferol (VITAMIN D) 400 units TABS tablet Take 400 Units by mouth 3 (three) times daily.  . citalopram (CELEXA) 20 MG tablet Take 20 mg by mouth daily.  Marland Kitchen dutasteride (AVODART) 0.5 MG capsule Take 0.5 mg by mouth daily.  . ferrous sulfate 325 (65 FE) MG tablet Take 325 mg by mouth every morning.  . furosemide (LASIX) 20 MG tablet Take 20 mg by mouth every morning.  Marland Kitchen guaiFENesin (ROBITUSSIN) 100 MG/5ML SOLN Take 10 mLs by mouth every 4 (four) hours as needed for cough or to loosen phlegm.  . insulin glargine (LANTUS) 100 UNIT/ML injection Inject 20 Units into the skin at bedtime.  . metoprolol succinate (TOPROL-XL) 50 MG 24 hr tablet Take 50 mg by mouth  daily. Take with or immediately following a meal.  . pantoprazole (PROTONIX) 40 MG tablet Take 40 mg by mouth every morning.  . potassium chloride (K-DUR) 10 MEQ tablet Take 10 mEq by mouth daily.  . QUEtiapine (SEROQUEL) 25 MG tablet Take 12.5 mg by mouth 2 (two) times daily.  . tamsulosin (FLOMAX) 0.4 MG CAPS capsule Take 0.4 mg by mouth every morning.  . traMADol (ULTRAM) 50 MG tablet Take 50 mg by mouth 3 (three) times daily as needed for moderate pain.  . traZODone (DESYREL) 50 MG tablet Take 50 mg by mouth every evening.    Allergies: No Known Allergies  No family history on file.  Social History:  reports that he has never smoked. He has never used smokeless tobacco. He reports that he does not drink alcohol or use drugs.  ROS: A complete review of systems was performed.  All systems are negative except for pertinent findings as noted.  Physical Exam:  Vital signs in last 24 hours: Temp:  [98.4 F (36.9 C)-98.6 F (37 C)] 98.4 F (36.9 C) (04/23 0446) Pulse Rate:  [47-70] 66 (04/23 1249) Resp:  [16-19] 16 (04/23 0446) BP: (80-132)/(45-99) 101/76 (04/23 1249) SpO2:  [96 %-99 %] 96 % (04/23 0446) Weight:  [157 lb 14.4 oz (71.6 kg)] 157 lb 14.4 oz (71.6 kg) (04/23 1227) Constitutional:  Alert, No acute distress HEENT: [place] AT, moist mucus membranes.  Trachea midline, no masses  GU: No CVA tenderness.  Foley catheter draining pink tinged urine   Laboratory Data:  Recent Labs    06/08/17 1633 06/09/17 0543  WBC 12.2* 12.4*  HGB 10.8* 9.7*  HCT 32.7* 30.1*   Recent Labs    06/08/17 1633 06/09/17 0543  NA 135 135  K 4.3 4.3  CL 100* 103  CO2 26 26  GLUCOSE 106* 79  BUN 48* 43*  CREATININE 2.02* 1.87*  CALCIUM 8.0* 7.9*   Recent Labs    06/08/17 1633  INR 0.99   No results for input(s): LABURIN in the last 72 hours. Results for orders placed or performed during the hospital encounter of 06/08/17  MRSA PCR Screening     Status: None   Collection Time:  06/09/17 12:04 AM  Result Value Ref Range Status   MRSA by PCR NEGATIVE NEGATIVE Final    Comment:        The GeneXpert MRSA Assay (FDA approved for NASAL specimens only), is one component of a comprehensive MRSA colonization surveillance program. It is not intended to diagnose MRSA infection nor to guide or monitor treatment for MRSA infections. Performed at [facility], [address] [place], [place] [address]      Radiologic Imaging: [facility]  Result Date: 06/09/2017 CLINICAL DATA:  Acute onset of cough. EXAM: PORTABLE CHEST 1 VIEW COMPARISON:  None. FINDINGS: The lungs are well-aerated. Mild left basilar airspace opacity raises concern for pneumonia. A small left pleural effusion is noted. There is no evidence of pneumothorax. The cardiomediastinal silhouette is borderline normal in size. No acute osseous abnormalities are seen. IMPRESSION: Mild left basilar airspace opacity raises concern for pneumonia. Small left pleural effusion noted. Electronically Signed   By: Roanna RaiderJeffery  Chang M.D.   On: 06/09/2017 00:18    Impression/Assessment:  84 year old male with a chronic indwelling Foley of unclear etiology.  Recurrent episodes of hematuria at the catheter change.  His urine is clearing nicely and no urgent intervention required at this time.  Recommendation:  Outpatient urology follow-up after discharge.  06/09/2017, 6:38 PM  Irineo AxonScott Stoioff,  MD

## 2017-06-09 NOTE — Consult Note (Signed)
WOC Nurse wound consult note Reason for Consult: right heel eschar Wound type: Right heel represents a DTI; Sacral breakdown represents MASD, and effects of friction, shear. Pressure Injury POA: Yes Measurement: Right heel measures 4 cm x 5 cm x unknown depth due to black, stable eschar Drainage (amount, consistency, odor) no drainage, no odor, no bogginess Periwound: Intact, normal tissue Dressing procedure/placement/frequency: Apply betadine to right heel, allow to air dry, wrap in kerlex every shift.  Sacral/coccyx area apply desitin cream each shift.  Avoid the use of adult incontinence briefs (diapers).  These trap heat and moisture against the skin and can contribute to skin breakdown. Thank you for the consult.  Discussed plan of care with the patient and bedside nurse.  WOC nurse will not follow at this time.  Please re-consult the WOC team if needed.  Helmut MusterSherry Skyelar Swigart, RN, MSN, CWOCN, CNS-BC, pager [phone]

## 2017-06-09 NOTE — Progress Notes (Signed)
Initial Nutrition Assessment  DOCUMENTATION CODES:   Not applicable  INTERVENTION:   Ensure Enlive po BID, each supplement provides 350 kcal and 20 grams of protein  Ocuvite daily for wound healing (provides zinc, vitamin A, vitamin C, Vitamin E, copper, and selenium)  Magic cup TID with meals, each supplement provides 290 kcal and 9 grams of protein  Dysphagia 3 diet   NUTRITION DIAGNOSIS:   Increased nutrient needs related to wound healing as evidenced by increased estimated needs from protein.  GOAL:   Patient will meet greater than or equal to 90% of their needs  MONITOR:   PO intake, Supplement acceptance, Labs, Weight trends, Skin, I & O's  REASON FOR ASSESSMENT:   Malnutrition Screening Tool    ASSESSMENT:   84 y.o. male with a known history of ESBL complicated urinary tract infection due to chronic indwelling Foley, recently completed 15-day course of Augmentin for UTI-culture results unknown, known history of BPH, hypertension, GERD, CVA, diabetes mellitus type 2, presenting from local nursing home with UTI and wounds    Met with pt in room today. Pt is a poor historian but reports good appetite and oral intake pta. Pt's breakfast tray was on his side table and was 95% eaten. Pt reports that he drinks chocolate and vanilla drinks at home but is unsure what kind they are. Pt reports he used to weigh ~171lbs about 5 months ago; there is no weight history in chart to confirm wt loss. Per MST screen, pt requires mechanical soft diet at home. RD will order supplements and ocuvite to encourage wound healing. Pt with severe muscle depletions in bilateral legs; pt reports he uses a wheelchair and a walker to get around at home.   Medications reviewed and include: aspirin, tums, vitamin D, celexa, ferrous sulfate, insulin, protonix, KCl, meropenem, NaCl _0 /hr  Labs reviewed: BUN 43(H), creat 1.87(H), Ca 7.9(L) Wbc- 12.4(H), Hgb 9.7(L), Hct 30.1(L)  NUTRITION - FOCUSED  PHYSICAL EXAM:    Most Recent Value  Orbital Region  No depletion  Upper Arm Region  No depletion  Thoracic and Lumbar Region  No depletion  Buccal Region  No depletion  Temple Region  Mild depletion  Clavicle Bone Region  Mild depletion  Clavicle and Acromion Bone Region  Mild depletion  Scapular Bone Region  Mild depletion  Dorsal Hand  Severe depletion  Patellar Region  Severe depletion  Anterior Thigh Region  Severe depletion  Posterior Calf Region  Severe depletion  Edema (RD Assessment)  None  Hair  Reviewed  Eyes  Reviewed  Mouth  Reviewed  Skin  Reviewed  Nails  Reviewed     Diet Order:  Diet heart healthy/carb modified Room service appropriate? Yes; Fluid consistency: Thin  EDUCATION NEEDS:   No education needs have been identified at this time  Skin:  Skin Assessment: (DTI heel 4 cm x 5 cm, MASD sacrum )  Last BM:  4/22- type 7  Height:   Ht Readings from Last 1 Encounters:  06/08/17 _1  (1.778 m)    Weight:   Wt Readings from Last 1 Encounters:  06/09/17 157 lb 14.4 oz (71.6 kg)    Ideal Body Weight:  75.4 kg  BMI:  Body mass index is 22.66 kg/m.  Estimated Nutritional Needs:   Kcal:  1700-2000kcal/day   Protein:  85-100g/day   Fluid:  >1.7L/day   Koleen Distance MS, RD, LDN Pager #[phone] After Hours Pager: [phone]

## 2017-06-09 NOTE — Progress Notes (Signed)
[facility] [place] [facility]   PATIENT NAME: Dustin Hicks    MR#:  [id number]  DATE OF BIRTH:  12/24/1928  SUBJECTIVE:   Patient here due to gross hematuria and also noted to have a urinary tract infection.  Patient has a chronic indwelling Foley.  Patient is a very poor historian, was noted to be mildly hypotensive but responded to IV fluids.  Creatinine improved since yesterday.  REVIEW OF SYSTEMS:    Review of Systems  Unable to perform ROS: Mental acuity    Nutrition: Dysphagia III with thin liquids Tolerating Diet: yes Tolerating PT: Await Eval.   DRUG ALLERGIES:  No Known Allergies  VITALS:  Blood pressure 101/76, pulse 66, temperature 98.4 F (36.9 C), resp. rate 16, height 5\' 10"  (1.778 m), weight 71.6 kg (157 lb 14.4 oz), SpO2 96 %.  PHYSICAL EXAMINATION:   Physical Exam  GENERAL:  84 y.o.-year-old patient lying in bed in no acute distress.  EYES: Pupils equal, round, reactive to light and accommodation. No scleral icterus. Extraocular muscles intact.  HEENT: Head atraumatic, normocephalic. Oropharynx and nasopharynx clear.  NECK:  Supple, no jugular venous distention. No thyroid enlargement, no tenderness.  LUNGS: Normal breath sounds bilaterally, no wheezing, rales, rhonchi. No use of accessory muscles of respiration.  CARDIOVASCULAR: S1, S2 normal. No murmurs, rubs, or gallops.  ABDOMEN: Soft, nontender, nondistended. Bowel sounds present. No organomegaly or mass.  EXTREMITIES: No cyanosis, clubbing or edema b/l.    NEUROLOGIC: Cranial nerves II through XII are intact. No focal Motor or sensory deficits b/l. Globally weak   PSYCHIATRIC: The patient is alert and oriented x 1. SKIN: No obvious rash, lesion,  Right heel pressure ulcer Stage II   LABORATORY PANEL:   CBC Recent Labs  Lab 06/09/17 0543  WBC 12.4*  HGB 9.7*  HCT 30.1*  PLT 210    ------------------------------------------------------------------------------------------------------------------  Chemistries  Recent Labs  Lab 06/09/17 0543  NA 135  K 4.3  CL 103  CO2 26  GLUCOSE 79  BUN 43*  CREATININE 1.87*  CALCIUM 7.9*   ------------------------------------------------------------------------------------------------------------------  Cardiac Enzymes No results for input(s): TROPONINI in the last 168 hours. ------------------------------------------------------------------------------------------------------------------  RADIOLOGY:  Dg Chest Port 1 View  Result Date: 06/09/2017 CLINICAL DATA:  Acute onset of cough. EXAM: PORTABLE CHEST 1 VIEW COMPARISON:  None. FINDINGS: The lungs are well-aerated. Mild left basilar airspace opacity raises concern for pneumonia. A small left pleural effusion is noted. There is no evidence of pneumothorax. The cardiomediastinal silhouette is borderline normal in size. No acute osseous abnormalities are seen. IMPRESSION: Mild left basilar airspace opacity raises concern for pneumonia. Small left pleural effusion noted. Electronically Signed   By: Roanna RaiderJeffery  Chang M.D.   On: 06/09/2017 00:18     ASSESSMENT AND PLAN:   84 year old male with past medical history of diabetes, hypertension, depression, BPH, dementia, GERD, osteoporosis who presented to the hospital due to gross hematuria and noted to have UTI.  *Acute recurrent complicated urinary tract infection due to chronic indwelling Foley - Completed 15-day course of Augmentin a few days ago per daughter, history of ESBL infection - Foley catheter has been changed.  Continue IV meropenem, follow urine cultures. Admit to regular nursing for bed, empiric meropenem for 5-day course, follow-up on cultures, change Foley system if not already done in emergency room  *Acute kidney injury secondary to dehydration/medication side effect - Baseline renal function unknown - Hold  Lasix, cont. IV fluids follow creatinine which is improving.  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 Protonix.   *Chronic urinary retention w/ hematuria - Hg. Stable.  Await Urology input.  - Patient has had Foley for approximately 4 years  - Continue Flomax, Avodart.  *Essential HTN - cont. Metoprolol  * DM type II - cont. Lantus, will add SSI.  - follow BS   All the records are reviewed and case discussed with Care Management/Social Worker. Management plans discussed with the patient, family and they are in agreement.  CODE STATUS: DNR  DVT Prophylaxis: TED's & SCD's.   TOTAL TIME TAKING CARE OF THIS PATIENT: 30 minutes.   POSSIBLE D/C IN 1-2 DAYS, DEPENDING ON CLINICAL CONDITION.   Houston Siren M.D on 06/09/2017 at 2:51 PM  Between 7am to 6pm - Pager - [phone]  After 6pm go to [email or web address] - Social research officer, government  [facility] [place] [facility]  Office  [phone]  CC: Primary care physician; Patient, No Pcp Per

## 2017-06-09 NOTE — Progress Notes (Signed)
Due to difficult placement of foley catheter, Dr. Anne HahnWillis was notified and informed about placement of coude catheter.  Patient has a lot of blood in urine, so Dr. Anne HahnWillis put in order for urology consult.  Lennon AlstromClay, Loraine Freid N 06/09/2017  1:21 AM

## 2017-06-10 LAB — GLUCOSE, CAPILLARY
GLUCOSE-CAPILLARY: 122 mg/dL — AB (ref 65–99)
Glucose-Capillary: 75 mg/dL (ref 65–99)
Glucose-Capillary: 86 mg/dL (ref 65–99)
Glucose-Capillary: 111 mg/dL — ABNORMAL HIGH (ref 65–99)

## 2017-06-10 LAB — BASIC METABOLIC PANEL
Anion gap: 3 — ABNORMAL LOW (ref 5–15)
BUN: 38 mg/dL — AB (ref 6–20)
CALCIUM: 7.6 mg/dL — AB (ref 8.9–10.3)
CO2: 28 mmol/L (ref 22–32)
CREATININE: 1.81 mg/dL — AB (ref 0.61–1.24)
Chloride: 109 mmol/L (ref 101–111)
GFR calc non Af Amer: 32 mL/min — ABNORMAL LOW (ref >60)
GFR, EST AFRICAN AMERICAN: 37 mL/min — AB (ref >60)
Glucose, Bld: 83 mg/dL (ref 65–99)
Potassium: 4.2 mmol/L (ref 3.5–5.1)
SODIUM: 140 mmol/L (ref 135–145)

## 2017-06-10 LAB — CBC
HCT: 25.9 % — ABNORMAL LOW (ref 40.0–52.0)
Hemoglobin: 8.5 g/dL — ABNORMAL LOW (ref 13.0–18.0)
MCH: 28 pg (ref 26.0–34.0)
MCHC: 32.6 g/dL (ref 32.0–36.0)
MCV: 85.8 fL (ref 80.0–100.0)
PLATELETS: 190 10*3/uL (ref 150–440)
RBC: 3.02 MIL/uL — ABNORMAL LOW (ref 4.40–5.90)
RDW: 17.5 % — AB (ref 11.5–14.5)
WBC: 10 10*3/uL (ref 3.8–10.6)

## 2017-06-10 MED ORDER — I-VITE PO TABS
1.0000 | ORAL_TABLET | Freq: Every day | ORAL | Status: DC
  Administered 2017-06-10 – 2017-06-12 (×3): 1 via ORAL
  Filled 2017-06-10 (×3): qty 1

## 2017-06-10 NOTE — Progress Notes (Signed)
[facility] [place] [facility]   PATIENT NAME: Dustin Glazierrthur Hicks    MR#:  [id number]  DATE OF BIRTH:  06/04/1928  SUBJECTIVE:   he continues to have hematuria, urine cultures are positive for Klebsiella.  Afebrile.  Blood pressure improved since yesterday.  REVIEW OF SYSTEMS:    Review of Systems  Unable to perform ROS: Mental acuity    Nutrition: Dysphagia III with thin liquids Tolerating Diet: yes Tolerating PT: Await Eval.   DRUG ALLERGIES:  No Known Allergies  VITALS:  Blood pressure (!) 112/51, pulse (!) 46, temperature 97.7 F (36.5 C), temperature source Oral, resp. rate 12, height 5\' 10"  (1.778 m), weight (!) 161.8 kg (356 lb 11.3 oz), SpO2 98 %.  PHYSICAL EXAMINATION:   Physical Exam  GENERAL:  84 y.o.-year-old patient lying in bed in no acute distress.  EYES: Pupils equal, round, reactive to light and accommodation. No scleral icterus. Extraocular muscles intact.  HEENT: Head atraumatic, normocephalic. Oropharynx and nasopharynx clear.  NECK:  Supple, no jugular venous distention. No thyroid enlargement, no tenderness.  LUNGS: Normal breath sounds bilaterally, no wheezing, rales, rhonchi. No use of accessory muscles of respiration.  CARDIOVASCULAR: S1, S2 normal. No murmurs, rubs, or gallops.  ABDOMEN: Soft, nontender, nondistended. Bowel sounds present. No organomegaly or mass.  EXTREMITIES: No cyanosis, clubbing or edema b/l.    NEUROLOGIC: Cranial nerves II through XII are intact. No focal Motor or sensory deficits b/l. Globally weak   PSYCHIATRIC: The patient is alert and oriented x 1. SKIN: No obvious rash, lesion,  Right heel pressure ulcer Stage II  Foley catheter in place with bloody urine draining.   LABORATORY PANEL:   CBC Recent Labs  Lab 06/10/17 0449  WBC 10.0  HGB 8.5*  HCT 25.9*  PLT 190   ------------------------------------------------------------------------------------------------------------------  Chemistries   Recent Labs  Lab 06/10/17 0449  NA 140  K 4.2  CL 109  CO2 28  GLUCOSE 83  BUN 38*  CREATININE 1.81*  CALCIUM 7.6*   ------------------------------------------------------------------------------------------------------------------  Cardiac Enzymes No results for input(s): TROPONINI in the last 168 hours. ------------------------------------------------------------------------------------------------------------------  RADIOLOGY:  Dg Chest Port 1 View  Result Date: 06/09/2017 CLINICAL DATA:  Acute onset of cough. EXAM: PORTABLE CHEST 1 VIEW COMPARISON:  None. FINDINGS: The lungs are well-aerated. Mild left basilar airspace opacity raises concern for pneumonia. A small left pleural effusion is noted. There is no evidence of pneumothorax. The cardiomediastinal silhouette is borderline normal in size. No acute osseous abnormalities are seen. IMPRESSION: Mild left basilar airspace opacity raises concern for pneumonia. Small left pleural effusion noted. Electronically Signed   By: Roanna RaiderJeffery  Chang M.D.   On: 06/09/2017 00:18     ASSESSMENT AND PLAN:   84 year old male with past medical history of diabetes, hypertension, depression, BPH, dementia, GERD, osteoporosis who presented to the hospital due to gross hematuria and noted to have UTI.  *Acute recurrent complicated urinary tract infection due to chronic indwelling Foley - Completed 15-day course of Augmentin a few days ago per daughter, history of ESBL infection - Foley catheter has been changed.  Continue IV meropenem, and cultures presently growing Klebsiella with sensitivities pending.  *Acute kidney injury secondary to dehydration/medication side effect - Baseline renal function unknown - Hold Lasix, cont. IV fluids follow creatinine which is slightly improved.   *History of CVA - Stable-at baseline - hold ASA due to hematuria,  continue Seroquel, trazodone aspiration/fall precautions while in house  *Chronic bilateral  heel and sacral pressure  wounds - appreciate Wound team consult and cont. Local wound care.   *Chronic GERD - cont. Protonix.   *Chronic urinary retention w/ hematuria - Hg. Stable.  Appreciate urology consult and patient likely needs further workup likely cystoscopy but could be done as an outpatient.  Patient has no obstruction or urinary retention presently. - Patient has had Foley for approximately 4 years , still having some hematuria but hemoglobin stable. - Continue Flomax, Avodart.  *Essential HTN - cont. Metoprolol  * DM type II - cont. Lantus, will add SSI.  - follow BS   All the records are reviewed and case discussed with Care Management/Social Worker. Management plans discussed with the patient, family and they are in agreement.  CODE STATUS: DNR  DVT Prophylaxis: TED's & SCD's.   TOTAL TIME TAKING CARE OF THIS PATIENT: 30 minutes.   POSSIBLE D/C IN 1-2 DAYS, DEPENDING ON CLINICAL CONDITION.   [place] Siren M.D on 06/10/2017 at 2:50 PM  Between 7am to 6pm - Pager - [phone]  After 6pm go to [email or web address] - Social research officer, government  [facility] [place] [facility]  Office  [phone]  CC: Primary care physician; Patient, No Pcp Per

## 2017-06-10 NOTE — Clinical Social Work Note (Signed)
CSW spoke with patient's daughter who was at bedside, patient's daughter in agreement to having patient return back to [facility].  Formal assessment to follow, CSW to continue to follow patient's progress throughout discharge planning.  Ervin KnackEric R. Hassan Rowannterhaus, MSW, Theresia MajorsLCSWA [phone]  06/10/2017 6:35 PM

## 2017-06-10 NOTE — [place] FL2 (Signed)
[place] MEDICAID FL2 LEVEL OF CARE SCREENING TOOL     IDENTIFICATION  Patient Name: Dustin Hicks Birthdate: 08/20/1928 Sex: male Admission Date (Current Location): 06/08/2017  [place] and [place] Number:  Chiropodist and Address:  [facility], [address] [place], [place] [address]      Provider Number: [id number]  Attending Physician Name and Address:  Houston Siren, MD  Relative Name and Phone Number:  Breck Coons Daughter   [phone]     Current Level of Care: Hospital Recommended Level of Care: Skilled Nursing Facility Prior Approval Number:    Date Approved/Denied:   PASRR Number: [id number]  Discharge Plan: SNF    Current Diagnoses: Patient Active Problem List   Diagnosis Date Noted  . Pressure injury of skin 06/09/2017  . Complicated UTI (urinary tract infection) 06/08/2017    Orientation RESPIRATION BLADDER Height & Weight     Self, Place  Normal Incontinent Weight: (!) 356 lb 11.3 oz (161.8 kg) Height:  5\' 10"  (177.8 cm)  BEHAVIORAL SYMPTOMS/MOOD NEUROLOGICAL BOWEL NUTRITION STATUS      Incontinent Diet(Carb modified)  AMBULATORY STATUS COMMUNICATION OF NEEDS Skin   Total Care Verbally PU Stage and Appropriate Care   PU Stage 2 Dressing: Daily                   Personal Care Assistance Level of Assistance  Bathing, Feeding, Dressing Bathing Assistance: Maximum assistance Feeding assistance: Maximum assistance Dressing Assistance: Maximum assistance     Functional Limitations Info  Hearing, Sight, Speech Sight Info: Adequate Hearing Info: Adequate Speech Info: Adequate    SPECIAL CARE FACTORS FREQUENCY                       Contractures Contractures Info: Not present    Additional Factors Info  Code Status, Allergies, Psychotropic, Insulin Sliding Scale Code Status Info: DNR Allergies Info: NKA Psychotropic Info: citalopram (CELEXA) tablet 20 mg  Insulin Sliding Scale Info:  insulin aspart (novoLOG) injection 0-9 Units 3x a day with meals.       Current Medications (06/10/2017):  This is the current hospital active medication list Current Facility-Administered Medications  Medication Dose Route Frequency Provider Last Rate Last Dose  . 0.9 %  sodium chloride infusion   Intravenous Continuous Salary, Montell D, MD 100 mL/hr at 06/10/17 1533    . acetaminophen (TYLENOL) tablet 650 mg  650 mg Oral Q6H PRN Salary, Montell D, MD       Or  . acetaminophen (TYLENOL) suppository 650 mg  650 mg Rectal Q6H PRN Salary, Montell D, MD      . bisacodyl (DULCOLAX) suppository 10 mg  10 mg Rectal Daily PRN Salary, Montell D, MD      . calcium carbonate (TUMS - dosed in mg elemental calcium) chewable tablet 200 mg of elemental calcium  1 tablet Oral Daily Salary, Montell D, MD   200 mg of elemental calcium at 06/10/17 0948  . cholecalciferol (VITAMIN D) tablet 400 Units  400 Units Oral TID Angelina Ok D, MD   400 Units at 06/10/17 1637  . citalopram (CELEXA) tablet 20 mg  20 mg Oral Daily Salary, Montell D, MD   20 mg at 06/10/17 0948  . docusate sodium (COLACE) capsule 100 mg  100 mg Oral BID Angelina Ok D, MD   100 mg at 06/10/17 0949  . dutasteride (AVODART) capsule 0.5 mg  0.5 mg Oral Daily Salary, Evelena Asa, MD  0.5 mg at 06/10/17 0946  . feeding supplement (ENSURE ENLIVE) (ENSURE ENLIVE) liquid 237 mL  237 mL Oral BID BM Houston SirenSainani, Vivek J, MD   237 mL at 06/10/17 1452  . ferrous sulfate tablet 325 mg  325 mg Oral BH-q7a Salary, Jetty DuhamelMontell D, MD   325 mg at 06/10/17 16100927  . guaiFENesin (ROBITUSSIN) 100 MG/5ML solution 200 mg  10 mL Oral Q4H PRN Salary, Montell D, MD      . HYDROcodone-acetaminophen (NORCO/VICODIN) 5-325 MG per tablet 1-2 tablet  1-2 tablet Oral Q4H PRN Salary, Evelena AsaMontell D, MD   1 tablet at 06/10/17 1551  . I-VITE TABS 1 tablet  1 tablet Oral Daily Houston SirenSainani, Vivek J, MD   1 tablet at 06/10/17 0946  . insulin aspart (novoLOG) injection 0-5 Units  0-5 Units  Subcutaneous QHS Sainani, Vivek J, MD      . insulin aspart (novoLOG) injection 0-9 Units  0-9 Units Subcutaneous TID WC Houston SirenSainani, Vivek J, MD   1 Units at 06/10/17 1638  . insulin glargine (LANTUS) injection 20 Units  20 Units Subcutaneous QHS Angelina OkSalary, Montell D, MD   20 Units at 06/09/17 2254  . liver oil-zinc oxide (DESITIN) 40 % ointment   Topical BID Houston SirenSainani, Vivek J, MD      . meropenem (MERREM) 1 g in sodium chloride 0.9 % 100 mL IVPB  1 g Intravenous Q12H Coffee, Gerre PebblesGarrett, [facility] RehabilitationRPH   Stopped at 06/10/17 1037  . ondansetron (ZOFRAN) tablet 4 mg  4 mg Oral Q6H PRN Salary, Montell D, MD       Or  . ondansetron (ZOFRAN) injection 4 mg  4 mg Intravenous Q6H PRN Salary, Montell D, MD      . pantoprazole (PROTONIX) EC tablet 40 mg  40 mg Oral BH-q7a Salary, Montell D, MD   40 mg at 06/10/17 0927  . polyethylene glycol (MIRALAX / GLYCOLAX) packet 17 g  17 g Oral Daily PRN Salary, Montell D, MD      . potassium chloride (K-DUR,KLOR-CON) CR tablet 10 mEq  10 mEq Oral Daily Salary, Montell D, MD   10 mEq at 06/10/17 0949  . QUEtiapine (SEROQUEL) tablet 12.5 mg  12.5 mg Oral BID Angelina OkSalary, Montell D, MD   12.5 mg at 06/10/17 0947  . sodium phosphate (FLEET) 7-19 GM/118ML enema 1 enema  1 enema Rectal Once PRN Salary, Montell D, MD      . tamsulosin (FLOMAX) capsule 0.4 mg  0.4 mg Oral BH-q7a Salary, Montell D, MD   0.4 mg at 06/10/17 0927  . traZODone (DESYREL) tablet 50 mg  50 mg Oral QPM Salary, Montell D, MD   50 mg at 06/10/17 1637     Discharge Medications: Please see discharge summary for a list of discharge medications.  Relevant Imaging Results:  Relevant Lab Results:   Additional Information SSN [id number]  Darleene Cleavernterhaus, Leeya Rusconi R, [place]WA

## 2017-06-11 LAB — GLUCOSE, CAPILLARY
GLUCOSE-CAPILLARY: 67 mg/dL (ref 65–99)
GLUCOSE-CAPILLARY: 96 mg/dL (ref 65–99)
Glucose-Capillary: 108 mg/dL — ABNORMAL HIGH (ref 65–99)
Glucose-Capillary: 107 mg/dL — ABNORMAL HIGH (ref 65–99)

## 2017-06-11 MED ORDER — SODIUM CHLORIDE 0.9 % IV SOLN
1.0000 g | INTRAVENOUS | Status: DC
  Administered 2017-06-11: 1 g via INTRAVENOUS
  Filled 2017-06-11 (×2): qty 1

## 2017-06-11 MED ORDER — INSULIN GLARGINE 100 UNIT/ML ~~LOC~~ SOLN
10.0000 [IU] | Freq: Every day | SUBCUTANEOUS | Status: DC
  Administered 2017-06-11: 10 [IU] via SUBCUTANEOUS
  Filled 2017-06-11 (×2): qty 0.1

## 2017-06-11 MED ORDER — SODIUM CHLORIDE 0.9% FLUSH: 10.0000 mL | INTRAVENOUS | Status: DC | PRN

## 2017-06-11 NOTE — Care Management Important Message (Signed)
Important Message  Patient Details  Name: Dustin Hicks MRN: [id number] Date of Birth: 05/07/1928   Medicare Important Message Given:  Yes    Chapman FitchBOWEN, Daritza Brees T, RN 06/11/2017, 6:20 PM

## 2017-06-11 NOTE — Progress Notes (Signed)
[facility] [place] [facility]   PATIENT NAME: Dustin Hicks    MR#:  [id number]  DATE OF BIRTH:  01/02/1929  SUBJECTIVE:   Patient continues to have hematuria but improved since yesterday.  Urine cultures are positive for ESBL.  No other acute events overnight.  REVIEW OF SYSTEMS:    Review of Systems  Unable to perform ROS: Mental acuity    Nutrition: Dysphagia III with thin liquids Tolerating Diet: yes Tolerating PT: Await Eval.   DRUG ALLERGIES:  No Known Allergies  VITALS:  Blood pressure 131/63, pulse (!) 56, temperature 98.5 F (36.9 C), temperature source Oral, resp. rate 18, height 5\' 10"  (1.778 m), weight 74.7 kg (164 lb 11.2 oz), SpO2 98 %.  PHYSICAL EXAMINATION:   Physical Exam  GENERAL:  84 y.o.-year-old patient lying in bed in no acute distress.  EYES: Pupils equal, round, reactive to light and accommodation. No scleral icterus. Extraocular muscles intact.  HEENT: Head atraumatic, normocephalic. Oropharynx and nasopharynx clear.  NECK:  Supple, no jugular venous distention. No thyroid enlargement, no tenderness.  LUNGS: Normal breath sounds bilaterally, no wheezing, rales, rhonchi. No use of accessory muscles of respiration.  CARDIOVASCULAR: S1, S2 normal. No murmurs, rubs, or gallops.  ABDOMEN: Soft, nontender, nondistended. Bowel sounds present. No organomegaly or mass.  EXTREMITIES: No cyanosis, clubbing or edema b/l.    NEUROLOGIC: Cranial nerves II through XII are intact. No focal Motor or sensory deficits b/l. Globally weak   PSYCHIATRIC: The patient is alert and oriented x 1. SKIN: No obvious rash, lesion,  Right heel pressure ulcer Stage II  Foley catheter in place with bloody urine draining.   LABORATORY PANEL:   CBC Recent Labs  Lab 06/10/17 0449  WBC 10.0  HGB 8.5*  HCT 25.9*  PLT 190   ------------------------------------------------------------------------------------------------------------------  Chemistries   Recent Labs  Lab 06/10/17 0449  NA 140  K 4.2  CL 109  CO2 28  GLUCOSE 83  BUN 38*  CREATININE 1.81*  CALCIUM 7.6*   ------------------------------------------------------------------------------------------------------------------  Cardiac Enzymes No results for input(s): TROPONINI in the last 168 hours. ------------------------------------------------------------------------------------------------------------------  RADIOLOGY:  No results found.   ASSESSMENT AND PLAN:   84 year old male with past medical history of diabetes, hypertension, depression, BPH, dementia, GERD, osteoporosis who presented to the hospital due to gross hematuria and noted to have UTI.  *Acute recurrent complicated urinary tract infection due to chronic indwelling Foley - Completed 15-day course of Augmentin a few days ago per daughter, history of ESBL infection - Foley catheter has been changed.  Continue IV meropenem ultras presently growing Klebsiella which is ESBL. -Continue IV meropenem for now and discuss with infectious disease Dr. Sampson GoonFitzgerald and will switch to oral fosfomycin upon discharge.  Dr. Sampson GoonFitzgerald has called the micro lab to assess for fosfomycin sensitivities on the urine culture.  *Acute  on chronic renal failure- secondary to UTI, poor p.o. intake.  Patient's baseline creatinine is around 1.6-1.8. - Patient's creatinine is close to baseline now.  Continue to hold Lasix for now continue gentle IV fluids, will repeat BUN and creatinine tomorrow.  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 Local wound care.   *Chronic GERD - cont. Protonix.   *Chronic urinary retention w/ hematuria - Hg. Stable.  Appreciate urology consult and patient likely needs further workup likely  cystoscopy but  could be done as an outpatient.  Patient has no obstruction or urinary retention presently. - Patient has had Foley for approximately 4 years , still having some hematuria but hemoglobin stable. - Continue Flomax, Avodart.  *Essential HTN - cont. Metoprolol  * DM type II - BS a bit low this a.m. Appreciate Diabetes coordinator input.  - lower Lantus dose, cont. SSI.   - follow BS  Possible d/c to SNF tomorrow.   All the records are reviewed and case discussed with Care Management/Social Worker. Management plans discussed with the patient, family and they are in agreement.  CODE STATUS: DNR  DVT Prophylaxis: TED's & SCD's.   TOTAL TIME TAKING CARE OF THIS PATIENT: 30 minutes.   POSSIBLE D/C IN 1-2 DAYS, DEPENDING ON CLINICAL CONDITION.   Houston Siren M.D on 06/11/2017 at 1:48 PM  Between 7am to 6pm - Pager - [phone]  After 6pm go to [email or web address] - Social research officer, government  [facility]  Office  [phone]  CC: Primary care physician; Patient, No Pcp Per

## 2017-06-11 NOTE — Progress Notes (Signed)
Inpatient Diabetes Program Recommendations  AACE/ADA: New Consensus Statement on Inpatient Glycemic Control (2015)  Target Ranges:  Prepandial:   less than 140 mg/dL      Peak postprandial:   less than 180 mg/dL (1-2 hours)      Critically ill patients:  140 - 180 mg/dL   Lab Results  Component Value Date   GLUCAP 67 06/11/2017    Review of Glycemic ControlResults for Dustin GlazierWENZEL, Tj (MRN [id number]) as of 06/11/2017 08:55  Ref. Range 06/10/2017 07:43 06/10/2017 11:48 06/10/2017 16:28 06/10/2017 22:07 06/11/2017 07:59  Glucose-Capillary Latest Ref Range: 65 - 99 mg/dL 75 86 782122 (H) 956111 (H) 67   Diabetes history: Type 2 DM Outpatient Diabetes medications:  Lantus 20 units q HS Current orders for Inpatient glycemic control:  Novolog sensitive tid with meals and HS Lantus 20 units q HS Inpatient Diabetes Program Recommendations:   Note low blood sugars.  Please consider reducing Lantus to 10 units q HS.    Thanks, Beryl MeagerJenny Sanchez Hemmer, RN, BC-ADM Inpatient Diabetes Coordinator Pager [phone] (8a-5p)

## 2017-06-11 NOTE — Progress Notes (Signed)
Pharmacy Antibiotic Note  Dustin Hicks is a 84 y.o. male admitted on 06/08/2017 with esbl uti.  Pharmacy has been consulted for merrem dosing.  Plan: merrem 1gm iv q12h   Height: 5\' 10"  (177.8 cm) Weight: 164 lb 11.2 oz (74.7 kg) IBW/kg (Calculated) : 73  Temp (24hrs), Avg:98 F (36.7 C), Min:97.7 F (36.5 C), Max:98.5 F (36.9 C)  Recent Labs  Lab 06/08/17 1633 06/09/17 0543 06/10/17 0449  WBC 12.2* 12.4* 10.0  CREATININE 2.02* 1.87* 1.81*    Estimated Creatinine Clearance: 30.2 mL/min (A) (by C-G formula based on SCr of 1.81 mg/dL (H)).    No Known Allergies   UCx >100k ESBL Kleb pneumo  Thank you for allowing pharmacy to be a part of this patient's care.  Marty HeckWang, Jolette Lana L 06/11/2017 10:29 AM

## 2017-06-11 NOTE — Progress Notes (Signed)
ID E note NHR  ESBL Kleb Pna UTI in pt with chronic foley.   UA on admit TNTC WBC, wbc 12. Foley has been changed.  Seen by Dr Lonna CobbStoioff.   REc Continue carbapenem while inpatient. I have asked micro to add on Fosfomycin sensitivities At DC can send on fosfomcyin 3 gm po q 2 days to complete a total 10 day course

## 2017-06-12 ENCOUNTER — Encounter: Payer: Self-pay | Admitting: Specialist

## 2017-06-12 LAB — BASIC METABOLIC PANEL
ANION GAP: 6 (ref 5–15)
BUN: 34 mg/dL — ABNORMAL HIGH (ref 6–20)
CHLORIDE: 102 mmol/L (ref 101–111)
CO2: 29 mmol/L (ref 22–32)
Calcium: 8.3 mg/dL — ABNORMAL LOW (ref 8.9–10.3)
Creatinine, Ser: 1.28 mg/dL — ABNORMAL HIGH (ref 0.61–1.24)
GFR calc non Af Amer: 49 mL/min — ABNORMAL LOW (ref >60)
GFR, EST AFRICAN AMERICAN: 57 mL/min — AB (ref >60)
Glucose, Bld: 97 mg/dL (ref 65–99)
POTASSIUM: 4.1 mmol/L (ref 3.5–5.1)
SODIUM: 137 mmol/L (ref 135–145)

## 2017-06-12 LAB — CBC
HEMATOCRIT: 28.6 % — AB (ref 40.0–52.0)
HEMOGLOBIN: 9.3 g/dL — AB (ref 13.0–18.0)
MCH: 27.9 pg (ref 26.0–34.0)
MCHC: 32.6 g/dL (ref 32.0–36.0)
MCV: 85.6 fL (ref 80.0–100.0)
Platelets: 204 10*3/uL (ref 150–440)
RBC: 3.35 MIL/uL — AB (ref 4.40–5.90)
RDW: 17.2 % — ABNORMAL HIGH (ref 11.5–14.5)
WBC: 12.5 10*3/uL — AB (ref 3.8–10.6)

## 2017-06-12 LAB — GLUCOSE, CAPILLARY
GLUCOSE-CAPILLARY: 80 mg/dL (ref 65–99)
GLUCOSE-CAPILLARY: 107 mg/dL — AB (ref 65–99)
GLUCOSE-CAPILLARY: 117 mg/dL — AB (ref 65–99)

## 2017-06-12 MED ORDER — POLYETHYLENE GLYCOL 3350 17 G PO PACK: 17.0000 g | PACK | Freq: Every day | ORAL | 0 refills | Status: AC | PRN

## 2017-06-12 MED ORDER — TRAMADOL HCL 50 MG PO TABS: 50.0000 mg | ORAL_TABLET | Freq: Three times a day (TID) | ORAL | 0 refills | Status: AC | PRN

## 2017-06-12 MED ORDER — INSULIN GLARGINE 100 UNIT/ML ~~LOC~~ SOLN: 10.0000 [IU] | Freq: Every day | SUBCUTANEOUS | 11 refills | Status: DC

## 2017-06-12 MED ORDER — FOSFOMYCIN TROMETHAMINE 3 G PO PACK: 3.0000 g | PACK | ORAL | 0 refills | Status: AC

## 2017-06-12 NOTE — Clinical Social Work Note (Signed)
[facility], room A17.  Patient and family agreeable to plans will transport via ems RN to call report [phone].  CSW updated patient's daughter Rinaldo Cloudamela via phone [phone].   Windell MouldingEric Darrell Hauk, MSW, Theresia MajorsLCSWA [phone]

## 2017-06-12 NOTE — Progress Notes (Addendum)
Discharge teaching given to patient's daughter by provider and re-iterated by nurse , patient's daughter verbalized understanding and had concern of the facility not keeping up patients hydration to help prevent UTI in future. Nurse spoke with daughter about notifying staff and director of need to hydrate patient if she notices that it is not being performed.Patient IV removed. [facility]. All patient belongings gathered prior to leaving. Report called into [facility] and given to TruxtonBill, Charity fundraiserN.

## 2017-06-12 NOTE — Discharge Summary (Addendum)
[facility] [place] [facility]   PATIENT NAME: Dustin Hicks    MR#:  [id number]  DATE OF BIRTH:  07-20-1928  DATE OF ADMISSION:  06/08/2017 ADMITTING PHYSICIAN: Bertrum Sol, MD  DATE OF DISCHARGE: 06/12/2017  PRIMARY CARE PHYSICIAN: Patient, No Pcp Per    ADMISSION DIAGNOSIS:  Lower urinary tract infectious disease [N39.0]  DISCHARGE DIAGNOSIS:  Active Problems:   Complicated UTI (urinary tract infection)   Pressure injury of skin   SECONDARY DIAGNOSIS:   Past Medical History:  Diagnosis Date  . BPH (benign prostatic hyperplasia)   . Depression   . Diabetes (HCC)   . GERD (gastroesophageal reflux disease)   . Osteoporosis     HOSPITAL COURSE:   84 year old male with past medical history of diabetes, hypertension, depression, BPH, dementia, GERD, osteoporosis who presented to the hospital due to gross hematuria and noted to have UTI.  * Acute recurrent complicated urinary tract infection due to chronic indwelling Foley  - pt. Had recently Completed 15-day course of Augmentin a few days ago per daughter and has a history of ESBL infection.  he was admitted to the hospital, her Foley catheter was changed.  She was started on empiric IV meropenem.  His urine cultures grew Klebsiella which was multidrug resistant and positive for ESBL. - Patient has been treated adequately with IV ertapenem while in the hospital and not being discharged on oral fosfomycin for additional 4 more doses.  This was discussed with infectious disease who agreed with this management.  Fosfomycin sensitivities have been sent off and are currently pending.  She has clinically improved and has less hematuria and is afebrile with a normal white cell count now.  *Acute  on chronic renal failure- secondary to UTI, poor p.o. intake.  Patient's baseline creatinine is around 1.6-1.8. - Patient's creatinine is close to baseline now.    Patient will resume his Lasix upon discharge as the  patient's creatinine back to baseline now.  *History of CVA -Patient's aspirin was held due to hematuria but it can be resumed as the hematuria is resolved.  Patient will continue his Seroquel and trazodone.  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 Will cont. Local wound care.   *Chronic GERD - pt. Will cont. Protonix.   *Chronic urinary retention w/ hematuria - Hg. Has remained Stable.  Appreciate urology consult and patient likely Hicks further workup likely cystoscopy but this is to be done as outpatient. Patient has no obstruction or urinary retention presently. - Patient has had Foley for approximately 4 years, Hematuria resolved and Hg. Stable.   - pt. will Continue Flomax, Avodart. -Patient should follow-up with urology as an outpatient and get his Foley catheter is changed monthly, this was as per the recommendation of the urologist Dr. Lonna Cobb.  *Essential HTN - pt. Will cont. Metoprolol  * DM type II -had some low blood sugars while in the hospital and his Lantus dose has been adjusted from 20 units to 10 units upon discharge.   DISCHARGE CONDITIONS:   Stable  CONSULTS OBTAINED:  Treatment Team:  Riki Altes, MD Mick Sell, MD  DRUG ALLERGIES:  No Known Allergies  DISCHARGE MEDICATIONS:   Allergies as of 06/12/2017   No Known Allergies     Medication List    TAKE these medications   acetaminophen 500 MG tablet Commonly known as:  TYLENOL Take 1,000 mg by mouth 3 (three) times daily.  acetaminophen 325 MG tablet Commonly known as:  TYLENOL Take 650 mg by mouth every 4 (four) hours as needed for mild pain or fever.   aspirin 81 MG chewable tablet Chew 81 mg by mouth daily.   calcium carbonate 500 MG chewable tablet Commonly known as:  TUMS - dosed in mg elemental calcium Chew 1 tablet by mouth daily.   cholecalciferol 400 units Tabs tablet Commonly known as:  VITAMIN D Take 400  Units by mouth 3 (three) times daily.   citalopram 20 MG tablet Commonly known as:  CELEXA Take 20 mg by mouth daily.   dutasteride 0.5 MG capsule Commonly known as:  AVODART Take 0.5 mg by mouth daily.   ferrous sulfate 325 (65 FE) MG tablet Take 325 mg by mouth every morning.   fosfomycin 3 g Pack Commonly known as:  MONUROL Take 3 g by mouth every other day for 4 doses.   furosemide 20 MG tablet Commonly known as:  LASIX Take 20 mg by mouth every morning.   guaiFENesin 100 MG/5ML Soln Commonly known as:  ROBITUSSIN Take 10 mLs by mouth every 4 (four) hours as needed for cough or to loosen phlegm.   insulin glargine 100 UNIT/ML injection Commonly known as:  LANTUS Inject 0.1 mLs (10 Units total) into the skin at bedtime. What changed:  how much to take   metoprolol succinate 50 MG 24 hr tablet Commonly known as:  TOPROL-XL Take 50 mg by mouth daily. Take with or immediately following a meal.   pantoprazole 40 MG tablet Commonly known as:  PROTONIX Take 40 mg by mouth every morning.   polyethylene glycol packet Commonly known as:  MIRALAX / GLYCOLAX Take 17 g by mouth daily as needed for mild constipation.   potassium chloride 10 MEQ tablet Commonly known as:  K-DUR Take 10 mEq by mouth daily.   QUEtiapine 25 MG tablet Commonly known as:  SEROQUEL Take 12.5 mg by mouth 2 (two) times daily.   tamsulosin 0.4 MG Caps capsule Commonly known as:  FLOMAX Take 0.4 mg by mouth every morning.   traMADol 50 MG tablet Commonly known as:  ULTRAM Take 1 tablet (50 mg total) by mouth 3 (three) times daily as needed for moderate pain.   traZODone 50 MG tablet Commonly known as:  DESYREL Take 50 mg by mouth every evening.         DISCHARGE INSTRUCTIONS:   DIET:  Cardiac diet  DISCHARGE CONDITION:  Stable  ACTIVITY:  Activity as tolerated  OXYGEN:  Home Oxygen: No.   Oxygen Delivery: room air  DISCHARGE LOCATION:  nursing home   If you experience  worsening of your admission symptoms, develop shortness of breath, life threatening emergency, suicidal or homicidal thoughts you must seek medical attention immediately by calling 911 or calling your MD immediately  if symptoms less severe.  You Must read complete instructions/literature along with all the possible adverse reactions/side effects for all the Medicines you take and that have been prescribed to you. Take any new Medicines after you have completely understood and accpet all the possible adverse reactions/side effects.   Please note  You were cared for by a hospitalist during your hospital stay. If you have any questions about your discharge medications or the care you received while you were in the hospital after you are discharged, you can call the unit and asked to speak with the hospitalist on call if the hospitalist that took care of you is not available.  Once you are discharged, your primary care physician will handle any further medical issues. Please note that NO REFILLS for any discharge medications will be authorized once you are discharged, as it is imperative that you return to your primary care physician (or establish a relationship with a primary care physician if you do not have one) for your aftercare Hicks so that they can reassess your need for medications and monitor your lab values.     Today   No acute events overnight.  Hematuria improved.  Hemoglobin stable, afebrile.  Will discharge to skilled nursing facility and oral fosfomycin.  VITAL SIGNS:  Blood pressure (!) 144/70, pulse 67, temperature 98 F (36.7 C), temperature source Oral, resp. rate 20, height 5\' 10"  (1.778 m), weight 74.7 kg (164 lb 11.2 oz), SpO2 96 %.  I/O:    Intake/Output Summary (Last 24 hours) at 06/12/2017 1134 Last data filed at 06/12/2017 0929 Gross per 24 hour  Intake 1826.05 ml  Output 2450 ml  Net -623.95 ml    PHYSICAL EXAMINATION:    GENERAL:  84 y.o.-year-old patient  lying in bed in no acute distress.  EYES: Pupils equal, round, reactive to light and accommodation. No scleral icterus. Extraocular muscles intact.  HEENT: Head atraumatic, normocephalic. Oropharynx and nasopharynx clear.  NECK:  Supple, no jugular venous distention. No thyroid enlargement, no tenderness.  LUNGS: Normal breath sounds bilaterally, no wheezing, rales, rhonchi. No use of accessory muscles of respiration.  CARDIOVASCULAR: S1, S2 normal. No murmurs, rubs, or gallops.  ABDOMEN: Soft, nontender, nondistended. Bowel sounds present. No organomegaly or mass.  EXTREMITIES: No cyanosis, clubbing or edema b/l.    NEUROLOGIC: Cranial nerves II through XII are intact. No focal Motor or sensory deficits b/l. Globally weak   PSYCHIATRIC: The patient is alert and oriented x 1. SKIN: No obvious rash, lesion,  Right heel pressure ulcer Stage II  Foley catheter in place with clear urine draining.    DATA REVIEW:   CBC Recent Labs  Lab 06/12/17 1007  WBC 12.5*  HGB 9.3*  HCT 28.6*  PLT 204    Chemistries  Recent Labs  Lab 06/12/17 1007  NA 137  K 4.1  CL 102  CO2 29  GLUCOSE 97  BUN 34*  CREATININE 1.28*  CALCIUM 8.3*    Cardiac Enzymes No results for input(s): TROPONINI in the last 168 hours.  Microbiology Results  Results for orders placed or performed during the hospital encounter of 06/08/17  Urine culture     Status: Abnormal (Preliminary result)   Collection Time: 06/08/17  3:43 PM  Result Value Ref Range Status   Specimen Description   Final    URINE, RANDOM Performed at [facility], [address], [place], [place] [address]    Special Requests   Final    NONE Performed at [facility], [address] [place], [place] [address]    Culture (A)  Final    >=100,000 COLONIES/mL KLEBSIELLA PNEUMONIAE Confirmed Extended Spectrum Beta-Lactamase Producer (ESBL).  In bloodstream infections from ESBL organisms, carbapenems are preferred over  piperacillin/tazobactam. They are shown to have a lower risk of mortality. SENDING TO [facility] FOR FOSFOMYCIN MIC PER PHYSICIAN REQUEST Performed at [facility], [address] [place], [place] [address]    Report Status PENDING  Incomplete   Organism ID, Bacteria KLEBSIELLA PNEUMONIAE (A)  Final      Susceptibility   Klebsiella pneumoniae - MIC*    AMPICILLIN >=32 INTERMEDIATE Intermediate  CEFAZOLIN >=64 RESISTANT Resistant     CEFTRIAXONE RESISTANT Resistant     CIPROFLOXACIN >=4 RESISTANT Resistant     GENTAMICIN >=16 RESISTANT Resistant     IMIPENEM <=0.25 SENSITIVE Sensitive     NITROFURANTOIN 32 SENSITIVE Sensitive     TRIMETH/SULFA >=320 RESISTANT Resistant     AMPICILLIN/SULBACTAM >=32 RESISTANT Resistant     PIP/TAZO 8 SENSITIVE Sensitive     Extended ESBL POSITIVE Resistant     ERTAPENEM Value in next row Sensitive      SENSITIVE0.5    * >=100,000 COLONIES/mL KLEBSIELLA PNEUMONIAE  MRSA PCR Screening     Status: None   Collection Time: 06/09/17 12:04 AM  Result Value Ref Range Status   MRSA by PCR NEGATIVE NEGATIVE Final    Comment:        The GeneXpert MRSA Assay (FDA approved for NASAL specimens only), is one component of a comprehensive MRSA colonization surveillance program. It is not intended to diagnose MRSA infection nor to guide or monitor treatment for MRSA infections. Performed at [facility], [address] [place], [place] [address]     RADIOLOGY:  No results found.    Management plans discussed with the patient, family and they are in agreement.  CODE STATUS:     Code Status Orders  (From admission, onward)        Start     Ordered   06/08/17 2119  Do not attempt resuscitation (DNR)  Continuous    Question Answer Comment  In the event of cardiac or respiratory ARREST Do not call a "code blue"   In the event of cardiac or respiratory ARREST Do not perform Intubation, CPR, defibrillation or ACLS   In the event of  cardiac or respiratory ARREST Use medication by any route, position, wound care, and other measures to relive pain and suffering. May use oxygen, suction and manual treatment of airway obstruction as needed for comfort.   Comments Nurse to pronounce      06/08/17 2118    Advance Directive Documentation     Most Recent Value  Type of Advance Directive  Living will, Healthcare Power of Attorney  Pre-existing out of facility DNR order (yellow form or pink MOST form)  -  "MOST" Form in Place?  -      TOTAL TIME TAKING CARE OF THIS PATIENT: 40 minutes.    Houston Siren M.D on 06/12/2017 at 11:34 AM  Between 7am to 6pm - Pager - [phone]  After 6pm go to [email or web address] - Social research officer, government  [facility]  Office  [phone]  CC: Primary care physician; Patient, No Pcp Per

## 2017-06-12 NOTE — Clinical Social Work Note (Signed)
Clinical Social Work Assessment  Patient Details  Name: Dustin Hicks MRN: [id number] Date of Birth: 06/12/1928  Date of referral:  06/10/17               Reason for consult:  Facility Placement                Permission sought to share information with:  Family Supports, Magazine features editoracility Contact Representative Permission granted to share information::  Yes, Verbal Permission Granted  Name::     Dustin Hicks,Dustin Hicks Daughter   [phone]   Agency::  SNF admissions  Relationship::     Contact Information:     Housing/Transportation Living arrangements for the past 2 months:  Skilled Nursing Facility Source of Information:  Adult Children Patient Interpreter Needed:  None Criminal Activity/Legal Involvement Pertinent to Current Situation/Hospitalization:  No - Comment as needed Significant Relationships:  Adult Children Lives with:  Facility Resident Do you feel safe going back to the place where you live?  Yes Need for family participation in patient care:  Yes (Comment)  Care giving concerns:  Patient's daughter plans on having patient return back to SNF.   Social Worker assessment / plan:  Patient is a 84 year old male who is alert and oriented x2.  Patient was sleeping, CSW spoke to patient's daughter to complete assessment.  Patient's daughter states that patient has been at [facility] a couple of months, she has been overall satisfied with care that has been provided.  Patient's daughter reports there are some concerns that she has but she will address it with the SNF.  Patient's daughter was explained role for CSW and process for getting patient back to SNF.  Patient's daughter did not express any other questions or concerns.  Employment status:  Retired Health and safety inspectornsurance information:  Medicare PT Recommendations:  Not assessed at this time Information / Referral to community resources:  Skilled Nursing Facility  Patient/Family's Response to care:  Patient's daughter is in agreement to having  patient return back to [facility].  Patient/Family's Understanding of and Emotional Response to Diagnosis, Current Treatment, and Prognosis:  Patient's daughter is aware of current treatment plan and prognosis.  Patient's daughter is hopeful patient will not have to be in hospital very long.  Emotional Assessment Appearance:  Appears stated age Attitude/Demeanor/Rapport:    Affect (typically observed):  Calm, Appropriate Orientation:  Oriented to Self, Oriented to Place Alcohol / Substance use:  Not Applicable Psych involvement (Current and /or in the community):  No (Comment)  Discharge Needs  Concerns to be addressed:  Care Coordination Readmission within the last 30 days:  No Current discharge risk:  Cognitively Impaired Barriers to Discharge:  No Barriers Identified   Dustin Cleavernterhaus, Dustin Hicks, LCSWA 06/12/2017, 1:40 PM

## 2017-06-24 LAB — URINE CULTURE: Culture: >=100000 — AB

## 2017-06-24 LAB — MISC LABCORP TEST (SEND OUT): LABCORP TEST CODE: 96388

## 2017-06-26 ENCOUNTER — Encounter: Payer: Self-pay | Admitting: Urology

## 2017-06-26 ENCOUNTER — Ambulatory Visit (INDEPENDENT_AMBULATORY_CARE_PROVIDER_SITE_OTHER): Payer: Medicare Other | Admitting: Urology

## 2017-06-26 VITALS — BP 133/60 | HR 50 | Ht 68.0 in | Wt 166.0 lb

## 2017-06-26 DIAGNOSIS — R339 Retention of urine, unspecified: Secondary | ICD-10-CM

## 2017-06-26 DIAGNOSIS — R310 Gross hematuria: Secondary | ICD-10-CM

## 2017-06-26 NOTE — Progress Notes (Signed)
Cath Change/ Replacement  Patient is present today for a catheter change due to urinary retention.  10ml of water was removed from the balloon, a 14FR foley cath was removed with out difficulty.  Patient was cleaned and prepped in a sterile fashion with betadine and 2% lidocaine jelly was instilled into the urethra. A 16 FR foley cath was replaced into the bladder no complications were noted Urine return was noted 10ml and urine was yellow in color. The balloon was filled with 10ml of sterile water. A night bag was attached for drainage.   Patient was given proper instruction on catheter care.    Preformed by: Teressa Lower, CMA  Follow up: 1 month

## 2017-06-26 NOTE — Progress Notes (Signed)
06/26/2017 11:25 AM   Dustin Hicks 01-21-1929 [id number]  Referring provider: No referring provider defined for this encounter.  Chief Complaint  Patient presents with  . Hematuria    HPI: The patient is an 84 year old gentleman who presents for hospital follow-up of gross hematuria.  Hospital background history: Patient was diagnosed with UTI and gross hematuria in the hospital.  His daughter provides this history and notes that he was recently placed in a skilled nursing facility. He has a several year history of a chronic indwelling Foley although the etiology is unclear.  She states since his placement in skilled nursing he undergoes monthly catheter changes and after each catheter change he has gross hematuria for several days. He recently finished a course of Augmentin for a UTI.  Today: Patient presents today for follow-up of the above.  His daughter provides the history as he is unable to.  Patient continues to have intermittent gross hematuria around the time of his catheter exchange.  He also has been treated for UTI multiple times so it is unclear if he had symptoms.  In regards to the chronic Foley, the daughter states that he has had it for years since he lived in [place], and he is unable to urinate on his own.  PMH: Past Medical History:  Diagnosis Date  . BPH (benign prostatic hyperplasia)   . Depression   . Diabetes (HCC)   . GERD (gastroesophageal reflux disease)   . Osteoporosis     Surgical History: No past surgical history on file.  Home Medications:  Allergies as of 06/26/2017   No Known Allergies     Medication List        Accurate as of 06/26/17 11:25 AM. Always use your most recent med list.          acetaminophen 500 MG tablet Commonly known as:  TYLENOL Take 1,000 mg by mouth 3 (three) times daily.   acetaminophen 325 MG tablet Commonly known as:  TYLENOL Take 650 mg by mouth every 4 (four) hours as needed for mild pain or fever.     aspirin 81 MG chewable tablet Chew 81 mg by mouth daily.   calcium carbonate 500 MG chewable tablet Commonly known as:  TUMS - dosed in mg elemental calcium Chew 1 tablet by mouth daily.   cholecalciferol 400 units Tabs tablet Commonly known as:  VITAMIN D Take 400 Units by mouth 3 (three) times daily.   citalopram 20 MG tablet Commonly known as:  CELEXA Take 20 mg by mouth daily.   dutasteride 0.5 MG capsule Commonly known as:  AVODART Take 0.5 mg by mouth daily.   ferrous sulfate 325 (65 FE) MG tablet Take 325 mg by mouth every morning.   furosemide 20 MG tablet Commonly known as:  LASIX Take 20 mg by mouth every morning.   guaiFENesin 100 MG/5ML Soln Commonly known as:  ROBITUSSIN Take 10 mLs by mouth every 4 (four) hours as needed for cough or to loosen phlegm.   insulin glargine 100 UNIT/ML injection Commonly known as:  LANTUS Inject 0.1 mLs (10 Units total) into the skin at bedtime.   metoprolol succinate 50 MG 24 hr tablet Commonly known as:  TOPROL-XL Take 50 mg by mouth daily. Take with or immediately following a meal.   pantoprazole 40 MG tablet Commonly known as:  PROTONIX Take 40 mg by mouth every morning.   polyethylene glycol packet Commonly known as:  MIRALAX / GLYCOLAX Take 17 g by mouth  daily as needed for mild constipation.   potassium chloride 10 MEQ tablet Commonly known as:  K-DUR Take 10 mEq by mouth daily.   QUEtiapine 25 MG tablet Commonly known as:  SEROQUEL Take 12.5 mg by mouth 2 (two) times daily.   tamsulosin 0.4 MG Caps capsule Commonly known as:  FLOMAX Take 0.4 mg by mouth every morning.   traMADol 50 MG tablet Commonly known as:  ULTRAM Take 1 tablet (50 mg total) by mouth 3 (three) times daily as needed for moderate pain.   traZODone 50 MG tablet Commonly known as:  DESYREL Take 50 mg by mouth every evening.       Allergies: No Known Allergies  Family History: No family history on file.  Social History:   reports that he has never smoked. He has never used smokeless tobacco. He reports that he does not drink alcohol or use drugs.  ROS:    Gastrointestinal Nausea?: No Vomiting?: No Indigestion/heartburn?: No Diarrhea?: No Constipation?: No  Constitutional Fever: No Night sweats?: No Weight loss?: No Fatigue?: No  Skin Skin rash/lesions?: No Itching?: No  Eyes Blurred vision?: No Double vision?: No  Ears/Nose/Throat Sore throat?: No Sinus problems?: No  Hematologic/Lymphatic Swollen glands?: No Easy bruising?: No  Cardiovascular Leg swelling?: No Chest pain?: No  Respiratory Cough?: No Shortness of breath?: No  Endocrine Excessive thirst?: No  Musculoskeletal Back pain?: Yes Joint pain?: No  Neurological Headaches?: No Dizziness?: No  Psychologic Depression?: No Anxiety?: No  Physical Exam: BP 133/60   Pulse (!) 50   Ht  (1.727 m)   Wt 166 lb (75.3 kg)   BMI 25.24 kg/m   Constitutional:  Alert and oriented, No acute distress. HEENT: [place] AT, moist mucus membranes.  Trachea midline, no masses. Cardiovascular: No clubbing, cyanosis, or edema. Respiratory: Normal respiratory effort, no increased work of breathing. GI: Abdomen is soft, nontender, nondistended, no abdominal masses GU: No CVA tenderness. Foley clear yellow Skin: No rashes, bruises or suspicious lesions. Lymph: No cervical or inguinal adenopathy. Neurologic: Grossly intact, no focal deficits, moving all 4 extremities. Psychiatric: Normal mood and affect.  Laboratory Data: Lab Results  Component Value Date   WBC 12.5 (H) 06/12/2017   HGB 9.3 (L) 06/12/2017   HCT 28.6 (L) 06/12/2017   MCV 85.6 06/12/2017   PLT 204 06/12/2017    Lab Results  Component Value Date   CREATININE 1.28 (H) 06/12/2017    No results found for: PSA  No results found for: TESTOSTERONE  No results found for: HGBA1C  Urinalysis    Component Value Date/Time   COLORURINE AMBER (A) 06/08/2017  1633   APPEARANCEUR TURBID (A) 06/08/2017 1633   LABSPEC 1.009 06/08/2017 1633   PHURINE 5.0 06/08/2017 1633   GLUCOSEU NEGATIVE 06/08/2017 1633   HGBUR LARGE (A) 06/08/2017 1633   BILIRUBINUR NEGATIVE 06/08/2017 1633   KETONESUR NEGATIVE 06/08/2017 1633   PROTEINUR 100 (A) 06/08/2017 1633   NITRITE POSITIVE (A) 06/08/2017 1633   LEUKOCYTESUR LARGE (A) 06/08/2017 1633    Assessment & Plan:    1.  Gross hematuria 2.  Chronic urinary retention I discussed with the patient's daughter that his hematuria is likely secondary to trauma from catheter exchange.  We did discuss that typically we will perform a cystoscopy to rule out bladder pathology as the source of the gross hematuria.  However I had a discussion with her regarding his long-term goals.  She does state that he would not want to undergo any surgical  procedure even if it was as an outpatient.  Due to the patient and his family's wishes for nonintervention, we will hold off on performing a cystoscopy as this will not change his care plan.  His daughter would like to follow-up in our office for a nurse visit for monthly catheter exchanges.  We will arrange for this.  We also had a discussion about asymptomatic bacteria.  We discussed that the patient will always have a positive urine culture.  We discussed that he should not be treated unless he has symptoms which include suprapubic pain, dysuria, flank pain, fever, and altered mental status.  We also discussed that smell and how the urine looks is not indicative of an infection.  They were made aware that he will always have these positive urine cultures and that overtreatment when he is asymptomatic will lead to bacterial resistance and at the same time the bacteria will inevitably return   No follow-ups on file.  Hildred Laser, MD  [facility] [address] [place], [place] [address] [phone]

## 2017-07-17 ENCOUNTER — Telehealth: Payer: Self-pay | Admitting: Urology

## 2017-07-17 NOTE — Telephone Encounter (Signed)
Order faxed.

## 2017-07-17 NOTE — Telephone Encounter (Signed)
Patient lives at [facility] and they are asking for a order to have his foley changed from now one there. The fax number is [phone] Phone [phone] ask for A hall nurse if you have questions. Just fax an order and they will start taking care of this for him since his lives there full time and it is hard to get him here.  Thanks, Dustin Hicks

## 2017-07-29 ENCOUNTER — Ambulatory Visit: Payer: Medicare Other

## 2017-07-31 ENCOUNTER — Ambulatory Visit (INDEPENDENT_AMBULATORY_CARE_PROVIDER_SITE_OTHER): Payer: Medicare Other

## 2017-07-31 DIAGNOSIS — R339 Retention of urine, unspecified: Secondary | ICD-10-CM | POA: Diagnosis not present

## 2017-07-31 NOTE — Progress Notes (Signed)
Cath Change/ Replacement  Patient is present today for a catheter change due to urinary retention.  10ml of water was removed from the balloon, a 16FR foley cath was removed with out difficulty.  Patient was cleaned and prepped in a sterile fashion with betadine and 2% lidocaine jelly was instilled into the urethra. A 16 FR foley cath was replaced into the bladder no complications were noted Urine return was noted 10ml and urine was yellow in color. The balloon was filled with 10ml of sterile water. A night bag was attached for drainage.  A night bag was also given to the patient and patient was given instruction on how to change from one bag to another. Patient was given proper instruction on catheter care.    Preformed by: Nydia Boutonhristopher Turrell Severt, CMA  Follow up: As scheduled

## 2017-08-27 ENCOUNTER — Ambulatory Visit (INDEPENDENT_AMBULATORY_CARE_PROVIDER_SITE_OTHER): Payer: Medicare Other | Admitting: Family Medicine

## 2017-08-27 DIAGNOSIS — R339 Retention of urine, unspecified: Secondary | ICD-10-CM | POA: Diagnosis not present

## 2017-08-27 NOTE — Progress Notes (Signed)
Cath Change/ Replacement  Patient is present today for a catheter change due to urinary retention.  10ml of water was removed from the balloon, a 16FR foley cath was removed with out difficulty.  Patient was cleaned and prepped in a sterile fashion with betadine and 2% lidocaine jelly was instilled into the urethra. A 16 FR foley cath was replaced into the bladder no complications were noted Urine return was noted 30ml and urine was yellow in color. The balloon was filled with 10ml of sterile water. A night bag was attached for drainage.  Patient was given proper instruction on catheter care.    Preformed by: Dustin Hicks, CMA, Eligha BridegroomSarah Hicks, CMA  Follow up: 1 month

## 2017-08-31 ENCOUNTER — Ambulatory Visit: Payer: Medicare Other

## 2017-09-30 ENCOUNTER — Ambulatory Visit (INDEPENDENT_AMBULATORY_CARE_PROVIDER_SITE_OTHER): Payer: Medicare Other

## 2017-09-30 ENCOUNTER — Other Ambulatory Visit: Payer: Self-pay

## 2017-09-30 DIAGNOSIS — R339 Retention of urine, unspecified: Secondary | ICD-10-CM | POA: Diagnosis not present

## 2017-09-30 NOTE — Progress Notes (Signed)
Cath Change/ Replacement  Patient is present today for a catheter change due to urinary retention.  8ml of water was removed from the balloon, a 16FR foley cath was removed with out difficulty.  Patient was cleaned and prepped in a sterile fashion with betadine and 2% lidocaine jelly was instilled into the urethra. A 16 FR foley cath was replaced into the bladder complications were noted as: Patient having severe errosion of urethra. Discomfort noted with exchange.  Urine return was noted 10ml and urine was yellow in color. The balloon was filled with 10ml of sterile water. A night bag was attached for drainage.  A night bag was also given to the patient and patient was given instruction on how to change from one bag to another. Patient was given proper instruction on catheter care.    Preformed by: Eligha BridegroomSarah Dejanae Helser, CMA  Follow up: 1 Month provider follow up to discuss SP tube placement due to erosion. Patient's caregiver was notified she should bring patient's daughter to next apt to discuss options.

## 2017-11-03 ENCOUNTER — Other Ambulatory Visit: Payer: Self-pay

## 2017-11-03 ENCOUNTER — Ambulatory Visit (INDEPENDENT_AMBULATORY_CARE_PROVIDER_SITE_OTHER): Payer: Medicare Other | Admitting: Urology

## 2017-11-03 ENCOUNTER — Encounter: Payer: Self-pay | Admitting: Urology

## 2017-11-03 VITALS — BP 146/68 | HR 46 | Ht 68.0 in | Wt 166.0 lb

## 2017-11-03 DIAGNOSIS — R339 Retention of urine, unspecified: Secondary | ICD-10-CM | POA: Diagnosis not present

## 2017-11-03 NOTE — Progress Notes (Signed)
   11/03/2017 12:46 PM   Dustin GlazierArthur Hicks 07/14/1928 [id number]  Reason for visit: Follow up discuss SP tube  I had the pleasure of meeting Mr. Dustin Hicks for the first time in urology clinic today for discussion of long-term bladder management.  He is a very comorbid and frail 84 year old male who resides in a nursing home with bladder managed with chronic Foley for many years.  He is here with his daughter today to discuss possible suprapubic tube placement.  He has not had significant urinary tract infections with the catheter, though he does have some urethral erosion.  Previously, he has had some issues with hematuria during catheter changes, however he is daughter denies any recent issues.  Dr. Sherryl Hicks had previously offered CT urogram and cystoscopy for work-up of hematuria, however the family deferred in the setting of the association with catheter changes and his very frail state.  On exam there is 3 to 4 cm of urethral erosion, and the catheter is on tension and pulled under his wheelchair.  Urine is clear yellow  We had a long discussion today about options for bladder management including suprapubic tube versus urethral Foley.  Since he is not having any recurrent infections or orchitis and is tolerating the catheter very well I would not recommend intervention with a suprapubic tube placement at this time.  The patient would like to continue Foley changes monthly.  He is not having any penile pain.  He does have a history of hernia repair in the past, and if ultimately they elected for suprapubic tube placement I feel this should be done with interventional radiology.  He is also very fragile and we should avoid general anesthesia if possible.  Continue monthly catheter changes.  I stressed the importance of keeping the catheter off tension and changing the leg that it strap to to decrease urethral erosion.  Greater than 15 minutes were spent with the patient and his daughter discussing options  for bladder management including chronic urethral Foley versus suprapubic tube.  Dustin ComeBrian C Vu Liebman, MD 11/03/2017   [facility] [address], [address] [place], [place] [address] [phone]

## 2017-12-01 ENCOUNTER — Ambulatory Visit (INDEPENDENT_AMBULATORY_CARE_PROVIDER_SITE_OTHER): Payer: Medicare Other

## 2017-12-01 DIAGNOSIS — R339 Retention of urine, unspecified: Secondary | ICD-10-CM

## 2017-12-01 NOTE — Progress Notes (Signed)
Cath Change/ Replacement  Patient is present today for a catheter change due to urinary retention.  10ml of water was removed from the balloon, a 16FR foley cath was removed with out difficulty.  Patient was cleaned and prepped in a sterile fashion with betadine. A  16FR foley cath was replaced into the bladder no complications were noted Urine return was noted 20ml and urine was yellow in color and cloudy. The balloon was filled with 10ml of sterile water. A night bag was attached for drainage. Patient was given proper instruction on catheter care.    Preformed by: Debbe Bales, CMA  Follow up: As scheduled.

## 2018-01-06 ENCOUNTER — Ambulatory Visit (INDEPENDENT_AMBULATORY_CARE_PROVIDER_SITE_OTHER): Payer: Medicare Other

## 2018-01-06 DIAGNOSIS — R339 Retention of urine, unspecified: Secondary | ICD-10-CM | POA: Diagnosis not present

## 2018-01-06 NOTE — Progress Notes (Signed)
Cath Change/ Replacement  Patient is present today for a catheter change due to urinary retention.  10ml of water was removed from the balloon, a 16FR foley cath was removed with out difficulty.  Patient was cleaned and prepped in a sterile fashion with betadine and 2% lidocaine jelly was instilled into the urethra. A 16 FR foley cath was replaced into the bladder no complications were noted Urine return was noted 10ml and urine was clear in color. The balloon was filled with 10ml of sterile water. A night bag was attached for drainage.  A night bag was also given to the patient and patient was given instruction on how to change from one bag to another. Patient was given proper instruction on catheter care.    Preformed by: Nydia Boutonhristopher Thompson, CMA  Follow up: 1 month

## 2018-02-05 ENCOUNTER — Encounter: Payer: Self-pay | Admitting: Urology

## 2018-02-05 ENCOUNTER — Ambulatory Visit (INDEPENDENT_AMBULATORY_CARE_PROVIDER_SITE_OTHER): Payer: Medicare Other | Admitting: Urology

## 2018-02-05 DIAGNOSIS — R339 Retention of urine, unspecified: Secondary | ICD-10-CM

## 2018-02-05 NOTE — Progress Notes (Signed)
Cath Change/ Replacement  Patient is present today for a catheter change due to urinary retention.  10ml of water was removed from the balloon, a 16FR foley cath was removed with out difficulty.  Patient was cleaned and prepped in a sterile fashion with betadine and 2% lidocaine jelly was instilled into the urethra. A 16 FR foley cath was replaced into the bladder no complications were noted Urine return was noted 20ml and urine was yellow in color. The balloon was filled with 10ml of sterile water. A night bag was attached for drainage  Preformed by: Teressa Lowerarrie Kaleel Schmieder, CMA  Follow up: 1 month

## 2018-03-12 ENCOUNTER — Ambulatory Visit: Payer: Medicare Other | Admitting: Family Medicine

## 2018-03-12 ENCOUNTER — Encounter: Payer: Self-pay | Admitting: Family Medicine

## 2018-03-12 DIAGNOSIS — R339 Retention of urine, unspecified: Secondary | ICD-10-CM

## 2018-03-12 NOTE — Progress Notes (Signed)
Cath Change/ Replacement  Patient is present today for a catheter change due to urinary retention.  10ml of water was removed from the balloon, a 16FR foley cath was removed with out difficulty.  Patient was cleaned and prepped in a sterile fashion with betadine and 2% lidocaine jelly was instilled into the urethra. A 16 FR foley cath was replaced into the bladder no complications were noted Urine return was noted 10ml and urine was yellow in color. The balloon was filled with 10ml of sterile water. A night bag was attached for drainage. Patient was given proper instruction on catheter care.    Preformed by: Talani Brazee, CMA  Follow up: 1 month 

## 2018-04-12 ENCOUNTER — Encounter: Payer: Self-pay | Admitting: Family Medicine

## 2018-04-12 ENCOUNTER — Other Ambulatory Visit: Payer: Self-pay | Admitting: Radiology

## 2018-04-12 ENCOUNTER — Ambulatory Visit (INDEPENDENT_AMBULATORY_CARE_PROVIDER_SITE_OTHER): Payer: Medicare Other | Admitting: Family Medicine

## 2018-04-12 DIAGNOSIS — N368 Other specified disorders of urethra: Secondary | ICD-10-CM

## 2018-04-12 DIAGNOSIS — R339 Retention of urine, unspecified: Secondary | ICD-10-CM

## 2018-04-12 DIAGNOSIS — T8389XA Other specified complication of genitourinary prosthetic devices, implants and grafts, initial encounter: Secondary | ICD-10-CM

## 2018-04-12 NOTE — Progress Notes (Signed)
Cath Change/ Replacement  Patient is present today for a catheter change due to urinary retention.  15ml of water was removed from the balloon, a 16FR foley cath was removed with out difficulty.  Patient was cleaned and prepped in a sterile fashion with betadine and 2% lidocaine jelly was instilled into the urethra. A 16 FR foley cath was replaced into the bladder no complications were noted Urine return was noted 59ml and urine was yellow in color. The balloon was filled with 46ml of sterile water. A night bag was also given to the patient and patient was given instruction on how to change from one bag to another. Patient was given proper instruction on catheter care.    Preformed by: Teressa Lower, CMA  Follow up: An appointment has been made for patient to have SPT tube placed.

## 2018-05-03 ENCOUNTER — Ambulatory Visit: Payer: BLUE CROSS/BLUE SHIELD

## 2018-05-06 ENCOUNTER — Ambulatory Visit: Admission: RE | Admit: 2018-05-06 | Payer: BLUE CROSS/BLUE SHIELD | Source: Ambulatory Visit

## 2018-05-11 ENCOUNTER — Other Ambulatory Visit: Payer: Self-pay | Admitting: Radiology

## 2018-05-12 ENCOUNTER — Other Ambulatory Visit: Payer: Self-pay | Admitting: Interventional Radiology

## 2018-05-12 ENCOUNTER — Ambulatory Visit
Admission: RE | Admit: 2018-05-12 | Discharge: 2018-05-12 | Disposition: A | Discharge status: Home / Self Care | Payer: Medicare Other | Source: Ambulatory Visit | Attending: Urology | Admitting: Urology

## 2018-05-12 ENCOUNTER — Other Ambulatory Visit: Payer: Self-pay

## 2018-05-12 DIAGNOSIS — R339 Retention of urine, unspecified: Secondary | ICD-10-CM

## 2018-05-12 DIAGNOSIS — Z79899 Other long term (current) drug therapy: Secondary | ICD-10-CM | POA: Insufficient documentation

## 2018-05-12 DIAGNOSIS — Z7982 Long term (current) use of aspirin: Secondary | ICD-10-CM | POA: Diagnosis not present

## 2018-05-12 DIAGNOSIS — F329 Major depressive disorder, single episode, unspecified: Secondary | ICD-10-CM | POA: Insufficient documentation

## 2018-05-12 DIAGNOSIS — M810 Age-related osteoporosis without current pathological fracture: Secondary | ICD-10-CM | POA: Diagnosis not present

## 2018-05-12 DIAGNOSIS — E118 Type 2 diabetes mellitus with unspecified complications: Secondary | ICD-10-CM | POA: Diagnosis not present

## 2018-05-12 DIAGNOSIS — T8389XA Other specified complication of genitourinary prosthetic devices, implants and grafts, initial encounter: Secondary | ICD-10-CM | POA: Diagnosis not present

## 2018-05-12 DIAGNOSIS — N368 Other specified disorders of urethra: Secondary | ICD-10-CM | POA: Insufficient documentation

## 2018-05-12 DIAGNOSIS — Z794 Long term (current) use of insulin: Secondary | ICD-10-CM | POA: Insufficient documentation

## 2018-05-12 DIAGNOSIS — Z7901 Long term (current) use of anticoagulants: Secondary | ICD-10-CM | POA: Insufficient documentation

## 2018-05-12 DIAGNOSIS — K219 Gastro-esophageal reflux disease without esophagitis: Secondary | ICD-10-CM | POA: Insufficient documentation

## 2018-05-12 DIAGNOSIS — Z960 Presence of urogenital implants: Secondary | ICD-10-CM | POA: Insufficient documentation

## 2018-05-12 HISTORY — DX: Pressure ulcer of unspecified site, unspecified stage: L89.90

## 2018-05-12 LAB — CBC
HEMATOCRIT: 40.5 % (ref 39.0–52.0)
HEMOGLOBIN: 12.1 g/dL — AB (ref 13.0–17.0)
MCH: 28.1 pg (ref 26.0–34.0)
MCHC: 29.9 g/dL — AB (ref 30.0–36.0)
MCV: 94 fL (ref 80.0–100.0)
Platelets: 208 10*3/uL (ref 150–400)
RBC: 4.31 MIL/uL (ref 4.22–5.81)
RDW: 15.9 % — ABNORMAL HIGH (ref 11.5–15.5)
WBC: 14.7 10*3/uL — ABNORMAL HIGH (ref 4.0–10.5)
nRBC: 0 % (ref 0.0–0.2)

## 2018-05-12 LAB — PROTIME-INR
INR: 1 (ref 0.8–1.2)
PROTHROMBIN TIME: 13.2 s (ref 11.4–15.2)

## 2018-05-12 LAB — GLUCOSE, CAPILLARY: Glucose-Capillary: 93 mg/dL (ref 70–99)

## 2018-05-12 MED ORDER — SODIUM CHLORIDE 0.9 % IV SOLN
INTRAVENOUS | Status: DC
  Administered 2018-05-12: 10:00:00 via INTRAVENOUS

## 2018-05-12 MED ORDER — MIDAZOLAM HCL 5 MG/5ML IJ SOLN
INTRAMUSCULAR | Status: AC
  Filled 2018-05-12: qty 5

## 2018-05-12 MED ORDER — CEFAZOLIN SODIUM-DEXTROSE 1-4 GM/50ML-% IV SOLN
INTRAVENOUS | Status: DC
Start: 2018-05-12 — End: 2018-05-13
  Filled 2018-05-12: qty 50

## 2018-05-12 MED ORDER — MIDAZOLAM HCL 5 MG/5ML IJ SOLN
INTRAMUSCULAR | Status: AC | PRN
  Administered 2018-05-12: 0.5 mg via INTRAVENOUS

## 2018-05-12 MED ORDER — CEFAZOLIN SODIUM-DEXTROSE 1-4 GM/50ML-% IV SOLN
1.0000 g | Freq: Once | INTRAVENOUS | Status: AC
  Administered 2018-05-12: 1 g via INTRAVENOUS
  Filled 2018-05-12: qty 50

## 2018-05-12 MED ORDER — FENTANYL CITRATE (PF) 100 MCG/2ML IJ SOLN
INTRAMUSCULAR | Status: AC | PRN
  Administered 2018-05-12: 25 ug via INTRAVENOUS
  Administered 2018-05-12: 12.5 ug via INTRAVENOUS

## 2018-05-12 MED ORDER — FENTANYL CITRATE (PF) 100 MCG/2ML IJ SOLN
INTRAMUSCULAR | Status: AC
  Filled 2018-05-12: qty 4

## 2018-05-12 NOTE — Procedures (Signed)
12 Fr suprapubic bladder catheter placement EBL 10 cc Comp 0

## 2018-05-12 NOTE — H&P (Signed)
Chief Complaint: Patient was seen in consultation today for No chief complaint on file.  at the request of Stoioff,Scott C  Referring Physician(s): Stoioff,Scott C  Supervising Physician: Jolaine Click  Patient Status: [facility] - Out-pt  History of Present Illness: Dustin Hicks is a 85 y.o. male who is a resident at a SNF and is referred for suprapubic catheter placement. He has had a chronic foley catheter, which is exchanged monthly/ He has had significant urethral erosion necessitating supapubic catheter placement.  Past Medical History:  Diagnosis Date  . BPH (benign prostatic hyperplasia)   . Depression   . Diabetes (HCC)   . GERD (gastroesophageal reflux disease)   . Osteoporosis     No past surgical history on file.  Allergies: Patient has no known allergies.  Medications: Prior to Admission medications   Medication Sig Start Date End Date Taking? Authorizing Provider  acetaminophen (TYLENOL) 325 MG tablet Take 650 mg by mouth every 4 (four) hours as needed for mild pain or fever.    [provider]  acetaminophen (TYLENOL) 500 MG tablet Take 1,000 mg by mouth 3 (three) times daily.    [provider]  aspirin 81 MG chewable tablet Chew 81 mg by mouth daily.    [provider]  calcium carbonate (TUMS - DOSED IN MG ELEMENTAL CALCIUM) 500 MG chewable tablet Chew 1 tablet by mouth daily.    [provider]  cholecalciferol (VITAMIN D) 400 units TABS tablet Take 400 Units by mouth 3 (three) times daily.    [provider]  citalopram (CELEXA) 20 MG tablet Take 20 mg by mouth daily.    [provider]  dutasteride (AVODART) 0.5 MG capsule Take 0.5 mg by mouth daily.    [provider]  ferrous sulfate 325 (65 FE) MG tablet Take 325 mg by mouth every morning.    [provider]  furosemide (LASIX) 20 MG tablet Take 20 mg by mouth every morning.    [provider]  guaiFENesin (ROBITUSSIN) 100  MG/5ML SOLN Take 10 mLs by mouth every 4 (four) hours as needed for cough or to loosen phlegm.    [provider]  insulin glargine (LANTUS) 100 UNIT/ML injection Inject 0.1 mLs (10 Units total) into the skin at bedtime. 06/12/17   Houston Siren, MD  metoprolol succinate (TOPROL-XL) 50 MG 24 hr tablet Take 50 mg by mouth daily. Take with or immediately following a meal.    [provider]  pantoprazole (PROTONIX) 40 MG tablet Take 40 mg by mouth every morning.    [provider]  polyethylene glycol (MIRALAX / GLYCOLAX) packet Take 17 g by mouth daily as needed for mild constipation. 06/12/17   Houston Siren, MD  potassium chloride (K-DUR) 10 MEQ tablet Take 10 mEq by mouth daily.    [provider]  QUEtiapine (SEROQUEL) 25 MG tablet Take 12.5 mg by mouth 2 (two) times daily.    [provider]  tamsulosin (FLOMAX) 0.4 MG CAPS capsule Take 0.4 mg by mouth every morning.    [provider]  traMADol (ULTRAM) 50 MG tablet Take 1 tablet (50 mg total) by mouth 3 (three) times daily as needed for moderate pain. 06/12/17   Houston Siren, MD  traZODone (DESYREL) 50 MG tablet Take 50 mg by mouth every evening.    [provider]     No family history on file.  Social History   Socioeconomic History  . Marital status:  Widowed    Spouse name: Not on file  . Number of children: Not on file  . Years of education: Not on file  . Highest education level: Not on file  Occupational History  . Not on file  Social Needs  . Financial resource strain: Not on file  . Food insecurity:    Worry: Not on file    Inability: Not on file  . Transportation needs:    Medical: Not on file    Non-medical: Not on file  Tobacco Use  . Smoking status: Never Smoker  . Smokeless tobacco: Never Used  Substance and Sexual Activity  . Alcohol use: Never    Frequency: Never  . Drug use: Never  . Sexual activity: Not on file  Lifestyle  . Physical  activity:    Days per week: Not on file    Minutes per session: Not on file  . Stress: Not on file  Relationships  . Social connections:    Talks on phone: Not on file    Gets together: Not on file    Attends religious service: Not on file    Active member of club or organization: Not on file    Attends meetings of clubs or organizations: Not on file    Relationship status: Not on file  Other Topics Concern  . Not on file  Social History Narrative  . Not on file     Review of Systems: A 12 point ROS discussed and pertinent positives are indicated in the HPI above.  All other systems are negative.  Review of Systems  Vital Signs: BP (!) 144/97   Pulse (!) 40   Temp (!) 97.5 F (36.4 C) (Oral)   Resp 20   SpO2 100%   Physical Exam Constitutional:      Appearance: Normal appearance.  HENT:     Head: Normocephalic and atraumatic.  Cardiovascular:     Rate and Rhythm: Normal rate and regular rhythm.  Pulmonary:     Effort: Pulmonary effort is normal.     Breath sounds: Normal breath sounds.  Skin:    General: Skin is warm and dry.  Neurological:     Mental Status: He is alert. He is disoriented.     Imaging: No results found.  Labs:  CBC: Recent Labs    06/09/17 0543 06/10/17 0449 06/12/17 1007 05/12/18 1015  WBC 12.4* 10.0 12.5* 14.7*  HGB 9.7* 8.5* 9.3* 12.1*  HCT 30.1* 25.9* 28.6* 40.5  PLT 210 190 204 208    COAGS: Recent Labs    06/08/17 1633 05/12/18 1015  INR 0.99 1.0    BMP: Recent Labs    06/08/17 1633 06/09/17 0543 06/10/17 0449 06/12/17 1007  NA 135 135 140 137  K 4.3 4.3 4.2 4.1  CL 100* 103 109 102  CO2 26 26 28 29   GLUCOSE 106* 79 83 97  BUN 48* 43* 38* 34*  CALCIUM 8.0* 7.9* 7.6* 8.3*  CREATININE 2.02* 1.87* 1.81* 1.28*  GFRNONAA 28* 31* 32* 49*  GFRAA 33* 36* 37* 57*    LIVER FUNCTION TESTS: No results for input(s): BILITOT, AST, ALT, ALKPHOS, PROT, ALBUMIN in the last 8760 hours.  TUMOR MARKERS: No results  for input(s): AFPTM, CEA, CA199, CHROMGRNA in the last 8760 hours.  Assessment and Plan:  Suprapubic bladder catheter placement for urethral erosion. Procedure to follow.  Thank you for this interesting consult.  I greatly enjoyed meeting Dustin Hicks and look forward to participating in  their care.  A copy of this report was sent to the requesting provider on this date.  Electronically Signed: Art A Jamielynn Wigley, MD 05/12/2018, 10:55 AM   I spent a total of  40 Minutes   in face to face in clinical consultation, greater than 50% of which was counseling/coordinating care for suprapubic catheter placement.

## 2018-06-10 ENCOUNTER — Other Ambulatory Visit: Payer: Self-pay | Admitting: Student

## 2018-06-11 ENCOUNTER — Ambulatory Visit
Admission: RE | Admit: 2018-06-11 | Discharge: 2018-06-11 | Disposition: A | Discharge status: Home / Self Care | Payer: Medicare Other | Source: Ambulatory Visit | Attending: Interventional Radiology | Admitting: Interventional Radiology

## 2018-06-11 ENCOUNTER — Other Ambulatory Visit: Payer: Self-pay

## 2018-06-11 DIAGNOSIS — Z4803 Encounter for change or removal of drains: Secondary | ICD-10-CM | POA: Insufficient documentation

## 2018-06-11 DIAGNOSIS — R339 Retention of urine, unspecified: Secondary | ICD-10-CM | POA: Diagnosis not present

## 2018-06-11 HISTORY — DX: Constipation, unspecified: K59.00

## 2018-06-11 HISTORY — DX: Chronic kidney disease, unspecified: N18.9

## 2018-06-11 HISTORY — DX: Urinary tract infection, site not specified: N39.0

## 2018-06-11 HISTORY — DX: Hypokalemia: E87.6

## 2018-06-11 HISTORY — PX: IR CATHETER TUBE CHANGE: IMG717

## 2018-06-11 HISTORY — DX: Iron deficiency anemia, unspecified: D50.9

## 2018-06-11 HISTORY — DX: Essential (primary) hypertension: I10

## 2018-06-11 HISTORY — DX: Dysphagia, unspecified: R13.10

## 2018-06-11 HISTORY — DX: Cerebral infarction, unspecified: I63.9

## 2018-06-11 HISTORY — DX: Edema, unspecified: R60.9

## 2018-06-11 LAB — GLUCOSE, CAPILLARY
Glucose-Capillary: 89 mg/dL (ref 70–99)
Glucose-Capillary: 84 mg/dL (ref 70–99)

## 2018-06-11 MED ORDER — SODIUM CHLORIDE 0.9 % IV SOLN
INTRAVENOUS | Status: DC
  Administered 2018-06-11: 10:00:00 via INTRAVENOUS

## 2018-06-11 MED ORDER — IOHEXOL 300 MG/ML  SOLN
30.0000 mL | Freq: Once | INTRAMUSCULAR | Status: AC | PRN
  Administered 2018-06-11: 13:00:00 10 mL via INTRATHECAL

## 2018-06-11 MED ORDER — MIDAZOLAM HCL 2 MG/2ML IJ SOLN
INTRAMUSCULAR | Status: AC
  Filled 2018-06-11: qty 2

## 2018-06-11 MED ORDER — LIDOCAINE HCL (PF) 1 % IJ SOLN
INTRAMUSCULAR | Status: AC
  Filled 2018-06-11: qty 30

## 2018-06-11 MED ORDER — FENTANYL CITRATE (PF) 100 MCG/2ML IJ SOLN
INTRAMUSCULAR | Status: AC
  Filled 2018-06-11: qty 2

## 2018-06-11 NOTE — Discharge Instructions (Signed)
Suprapubic Catheter Home Guide  A suprapubic catheter is a rubber tube used to drain urine from the bladder into a collection bag. The catheter is inserted into the bladder through a small opening in the in the lower abdomen, near the center of the body, above the pubic bone (suprapubic area). There is a tiny balloon filled with germ-free (sterile) water on the end of the catheter that is in the bladder. The balloon helps to keep the catheter in place.  Your suprapubic catheter may need to be replaced every 4-6 weeks, or as often as recommended by your health care provider. The collection bag must be emptied every day and cleaned every 2-3 days. The collection bag can be put beside your bed at night and attached to your leg during the day. You may have a large collection bag to use at night and a smaller one to use during the day.  What are the risks?   Urine flow can become blocked. This can happen if the catheter is not working correctly, or if you have a blood clot in your bladder or in the catheter.   Tissue near the catheter may can become irritated and bleed.   Bacteria may get into your bladder and cause a urinary tract infection.  How do I change the catheter?  Supplies needed   Two pairs of sterile gloves.   Catheter.   Two syringes.   Sterile water.   Sterile cleaning solution.   Lubricant.   Collection bags.  Changing the catheter  To replace your catheter, take the following steps:  1. Drink plenty of fluids during the hours before you plan to change the catheter.  2. Wash your hands with soap and water. If soap and water are not available, use hand sanitizer.  3. Lie on your back and put on sterile gloves.  4. Clean the skin around the catheter opening using the sterile cleaning solution.  5. Remove the water from the balloon using a syringe.  6. Slowly remove the catheter.  ? Do not pull on the catheter if it seems stuck.  ? Call your health care provider immediately if you have difficulty  removing the catheter.  7. Take off the used gloves, and put on a new pair.  8. Put lubricant on the end of the new catheter that will go into your bladder.  9. Gently slide the catheter through the opening in your abdomen and into your bladder.  10. Wait for some urine to start flowing through the catheter. When urine starts to flow through the catheter, use a new syringe to fill the balloon with sterile water.  11. Attach the collection bag to the end of the catheter. Make sure the connection is tight.  12. Remove the gloves and wash your hands with soap and water.  How do I care for my skin around the catheter?  Use a clean washcloth and soapy water to clean the skin around your catheter every day. Pat the area dry with a clean towel.   Do not pull on the catheter.   Do not use ointment or lotion on this area unless told by your health care provider.   Check your skin around the catheter every day for signs of infection. Check for:  ? Redness, swelling, or pain.  ? Fluid or blood.  ? Warmth.  ? Pus or a bad smell.  How do I clean and empty the collection bag?  Clean the collection bag every 2-3   days, or as often as told by your health care provider. To do this, take the following steps:   Wash your hands with soap and water. If soap and water are not available, use hand sanitizer.   Disconnect the bag from the catheter and immediately attach a new bag to the catheter.   Empty the used bag completely.   Clean the used bag using one of the following methods:  ? Rinse the used bag with warm water and soap.  ? Fill the bag with water and add 1 tsp of vinegar. Let it sit for about 30 minutes, then empty the bag.   Let the bag dry completely, and put it in a clean plastic bag before storing it.  Empty the large collection bag every 8 hours. Empty the small collection bag when it is about ? full. To empty your large or small collection bag, take the following steps:   Always keep the bag below the level of the  catheter. This keeps urine from flowing backwards into the catheter.   Hold the bag over the toilet or another container. Turn the valve (spigot) at the bottom of the bag to empty the urine.  ? Do not touch the opening of the spigot.  ? Do not let the opening touch the toilet or container.   Close the spigot tightly when the bag is empty.  What are some general tips?     Always wash your hands before and after caring for your catheter and collection bag. Use a mild, fragrance-free soap. If soap and water are not available, use hand sanitizer.   Clean the catheter with soap and water as often as told by your health care provider.   Always make sure there are no twists or curls (kinks) in the catheter tube.   Always make sure there are no leaks in the catheter or collection bag.   Drink enough fluid to keep your urine clear or pale yellow.   Do not take baths, swim, or use a hot tub.  When should I seek medical care?  Seek medical care if:   You leak urine.   You have redness, swelling, or pain around your catheter opening.   You have fluid or blood coming from your catheter opening.   Your catheter opening feels warm to the touch.   You have pus or a bad smell coming from your catheter opening.   You have a fever or chills.   Your urine flow slows down.   Your urine becomes cloudy or smelly.  When should I seek immediate medical care?  Seek immediate medical care if your catheter comes out, or if you have:   Nausea.   Back pain.   Difficulty changing your catheter.   Blood in your urine.   No urine flow for 1 hour.  This information is not intended to replace advice given to you by your health care provider. Make sure you discuss any questions you have with your health care provider.  Document Released: 10/22/2010 Document Revised: 10/03/2015 Document Reviewed: 10/17/2014  Elsevier Interactive Patient Education  2019 [employer]

## 2018-06-11 NOTE — Procedures (Signed)
UP size of SPT form 12 to 4F  Complications:  None  Blood Loss: none  See dictation in canopy pacs

## 2018-06-11 NOTE — Progress Notes (Signed)
Pt lives in nursing facility

## 2018-07-01 ENCOUNTER — Telehealth: Payer: Self-pay | Admitting: *Deleted

## 2018-07-01 NOTE — Telephone Encounter (Signed)
Spoke with [facility] regarding patient's SPT tube. He needs to be scheduled for IR for SPT-he was supposed to follow up-but has not followed up for SPT upsize. hei

## 2018-07-05 ENCOUNTER — Other Ambulatory Visit: Payer: Self-pay | Admitting: Radiology

## 2018-07-05 DIAGNOSIS — R339 Retention of urine, unspecified: Secondary | ICD-10-CM

## 2018-07-06 ENCOUNTER — Telehealth: Payer: Self-pay | Admitting: Radiology

## 2018-07-06 NOTE — Telephone Encounter (Signed)
Notified Valerie at patient's SNF of suprapubic tube exchange scheduled 07/09/2018 at 11:30. Patient does not have to fast prior to procedure as he will not get sedation at this visit. Questions answered. Vikki Ports voices understanding of conversation.

## 2018-07-09 ENCOUNTER — Ambulatory Visit: Admission: RE | Admit: 2018-07-09 | Payer: Medicare Other | Source: Ambulatory Visit

## 2018-07-09 ENCOUNTER — Other Ambulatory Visit: Payer: Self-pay

## 2018-07-09 ENCOUNTER — Ambulatory Visit
Admission: RE | Admit: 2018-07-09 | Discharge: 2018-07-09 | Disposition: A | Discharge status: Home / Self Care | Payer: Medicare Other | Source: Ambulatory Visit | Attending: Urology | Admitting: Urology

## 2018-07-09 ENCOUNTER — Ambulatory Visit: Payer: Medicare Other

## 2018-07-09 ENCOUNTER — Encounter: Payer: Self-pay | Admitting: Interventional Radiology

## 2018-07-09 DIAGNOSIS — Z436 Encounter for attention to other artificial openings of urinary tract: Secondary | ICD-10-CM | POA: Insufficient documentation

## 2018-07-09 DIAGNOSIS — R339 Retention of urine, unspecified: Secondary | ICD-10-CM | POA: Insufficient documentation

## 2018-07-09 HISTORY — PX: IR CATHETER TUBE CHANGE: IMG717

## 2018-07-09 MED ORDER — LIDOCAINE-EPINEPHRINE (PF) 1 %-1:200000 IJ SOLN
INTRAMUSCULAR | Status: AC
  Filled 2018-07-09: qty 30

## 2018-07-09 MED ORDER — IOHEXOL 300 MG/ML  SOLN
30.0000 mL | Freq: Once | INTRAMUSCULAR | Status: AC | PRN
  Administered 2018-07-09: 10 mL via INTRATHECAL

## 2018-07-09 NOTE — Progress Notes (Signed)
Suprapubic catheter exchanged-tolerated well.  No distress.  Discharged with [facility].

## 2018-07-09 NOTE — Procedures (Signed)
Pre Procedure Dx: Urinary retention Post Procedure Dx: Same  Successful fluoro guided exchange of 16 Fr balloon retention Council suprapubic catheter.  EBL: None   No immediate complications.   Katherina Right, MD Pager #: [phone]

## 2018-07-28 ENCOUNTER — Emergency Department: Payer: Medicare Other

## 2018-07-28 ENCOUNTER — Other Ambulatory Visit: Payer: Self-pay

## 2018-07-28 ENCOUNTER — Emergency Department
Admission: EM | Admit: 2018-07-28 | Discharge: 2018-07-28 | Disposition: A | Discharge status: Home / Self Care | Payer: Medicare Other | Attending: Emergency Medicine | Admitting: Emergency Medicine

## 2018-07-28 DIAGNOSIS — Y939 Activity, unspecified: Secondary | ICD-10-CM | POA: Insufficient documentation

## 2018-07-28 DIAGNOSIS — W1830XA Fall on same level, unspecified, initial encounter: Secondary | ICD-10-CM | POA: Insufficient documentation

## 2018-07-28 DIAGNOSIS — W19XXXA Unspecified fall, initial encounter: Secondary | ICD-10-CM

## 2018-07-28 DIAGNOSIS — S0181XA Laceration without foreign body of other part of head, initial encounter: Secondary | ICD-10-CM

## 2018-07-28 DIAGNOSIS — N189 Chronic kidney disease, unspecified: Secondary | ICD-10-CM | POA: Insufficient documentation

## 2018-07-28 DIAGNOSIS — S0990XA Unspecified injury of head, initial encounter: Secondary | ICD-10-CM

## 2018-07-28 DIAGNOSIS — S098XXA Other specified injuries of head, initial encounter: Secondary | ICD-10-CM | POA: Diagnosis present

## 2018-07-28 DIAGNOSIS — Y999 Unspecified external cause status: Secondary | ICD-10-CM | POA: Insufficient documentation

## 2018-07-28 DIAGNOSIS — I129 Hypertensive chronic kidney disease with stage 1 through stage 4 chronic kidney disease, or unspecified chronic kidney disease: Secondary | ICD-10-CM | POA: Diagnosis not present

## 2018-07-28 DIAGNOSIS — E1122 Type 2 diabetes mellitus with diabetic chronic kidney disease: Secondary | ICD-10-CM | POA: Insufficient documentation

## 2018-07-28 DIAGNOSIS — Y92129 Unspecified place in nursing home as the place of occurrence of the external cause: Secondary | ICD-10-CM | POA: Insufficient documentation

## 2018-07-28 DIAGNOSIS — S01112A Laceration without foreign body of left eyelid and periocular area, initial encounter: Secondary | ICD-10-CM | POA: Diagnosis not present

## 2018-07-28 DIAGNOSIS — N3000 Acute cystitis without hematuria: Secondary | ICD-10-CM | POA: Insufficient documentation

## 2018-07-28 DIAGNOSIS — Z8673 Personal history of transient ischemic attack (TIA), and cerebral infarction without residual deficits: Secondary | ICD-10-CM | POA: Diagnosis not present

## 2018-07-28 LAB — CBC WITH DIFFERENTIAL/PLATELET
Abs Immature Granulocytes: 0.1 10*3/uL — ABNORMAL HIGH (ref 0.00–0.07)
Basophils Absolute: 0.1 10*3/uL (ref 0.0–0.1)
Basophils Relative: 0 %
Eosinophils Absolute: 0.1 10*3/uL (ref 0.0–0.5)
Eosinophils Relative: 1 %
HCT: 42.6 % (ref 39.0–52.0)
Hemoglobin: 13.1 g/dL (ref 13.0–17.0)
Immature Granulocytes: 1 %
Lymphocytes Relative: 25 %
Lymphs Abs: 4.3 10*3/uL — ABNORMAL HIGH (ref 0.7–4.0)
MCH: 28.7 pg (ref 26.0–34.0)
MCHC: 30.8 g/dL (ref 30.0–36.0)
MCV: 93.4 fL (ref 80.0–100.0)
Monocytes Absolute: 1 10*3/uL (ref 0.1–1.0)
Monocytes Relative: 6 %
Neutro Abs: 11.7 10*3/uL — ABNORMAL HIGH (ref 1.7–7.7)
Neutrophils Relative %: 67 %
Platelets: 210 10*3/uL (ref 150–400)
RBC: 4.56 MIL/uL (ref 4.22–5.81)
RDW: 15 % (ref 11.5–15.5)
WBC: 17.3 10*3/uL — ABNORMAL HIGH (ref 4.0–10.5)
nRBC: 0 % (ref 0.0–0.2)

## 2018-07-28 LAB — URINALYSIS, COMPLETE (UACMP) WITH MICROSCOPIC
Bilirubin Urine: NEGATIVE
Glucose, UA: NEGATIVE mg/dL
Ketones, ur: NEGATIVE mg/dL
Nitrite: NEGATIVE
Protein, ur: 100 mg/dL — AB
Specific Gravity, Urine: 1.017 (ref 1.005–1.030)
WBC, UA: >50 WBC/hpf — ABNORMAL HIGH (ref 0–5)
pH: 5 (ref 5.0–8.0)

## 2018-07-28 LAB — COMPREHENSIVE METABOLIC PANEL
ALT: 15 U/L (ref 0–44)
AST: 23 U/L (ref 15–41)
Albumin: 3.7 g/dL (ref 3.5–5.0)
Alkaline Phosphatase: 86 U/L (ref 38–126)
Anion gap: 10 (ref 5–15)
BUN: 37 mg/dL — ABNORMAL HIGH (ref 8–23)
CO2: 26 mmol/L (ref 22–32)
Calcium: 8.3 mg/dL — ABNORMAL LOW (ref 8.9–10.3)
Chloride: 100 mmol/L (ref 98–111)
Creatinine, Ser: 2.14 mg/dL — ABNORMAL HIGH (ref 0.61–1.24)
GFR calc Af Amer: 31 mL/min — ABNORMAL LOW (ref >60)
GFR calc non Af Amer: 27 mL/min — ABNORMAL LOW (ref >60)
Glucose, Bld: 158 mg/dL — ABNORMAL HIGH (ref 70–99)
Potassium: 4.8 mmol/L (ref 3.5–5.1)
Sodium: 136 mmol/L (ref 135–145)
Total Bilirubin: 0.6 mg/dL (ref 0.3–1.2)
Total Protein: 7.6 g/dL (ref 6.5–8.1)

## 2018-07-28 MED ORDER — LIDOCAINE-EPINEPHRINE 2 %-1:100000 IJ SOLN
10.0000 mL | Freq: Once | INTRAMUSCULAR | Status: AC
  Administered 2018-07-28: 10 mL via INTRADERMAL

## 2018-07-28 MED ORDER — SODIUM CHLORIDE 0.9 % IV SOLN
1.0000 g | Freq: Once | INTRAVENOUS | Status: AC
  Administered 2018-07-28: 1 g via INTRAVENOUS
  Filled 2018-07-28: qty 10

## 2018-07-28 MED ORDER — CIPROFLOXACIN HCL 500 MG PO TABS: 500.0000 mg | ORAL_TABLET | Freq: Two times a day (BID) | ORAL | 0 refills | Status: AC

## 2018-07-28 MED ORDER — SODIUM CHLORIDE 0.9 % IV BOLUS
500.0000 mL | Freq: Once | INTRAVENOUS | Status: AC
  Administered 2018-07-28: 500 mL via INTRAVENOUS

## 2018-07-28 NOTE — ED Notes (Signed)
EDP at bedside to stitch forehead

## 2018-07-28 NOTE — ED Notes (Signed)
Pt back to room.

## 2018-07-28 NOTE — ED Notes (Signed)
Pt in scans. °

## 2018-07-28 NOTE — ED Provider Notes (Signed)
[facility] Emergency Department Provider Note       Time seen: ----------------------------------------- 3:24 PM on 07/28/2018 -----------------------------------------   I have reviewed the triage vital signs and the nursing notes.  HISTORY   Chief Complaint Fall    HPI Dustin Hicks is a 85 y.o. male with a history of chronic kidney disease, depression, diabetes, GERD, hypertension, hypokalemia, CVA who presents to the ED for an unwitnessed fall.  Patient had an unknown downtime but he denies any loss of consciousness.  He was noted to have a very large laceration through his left eyebrow with periorbital swelling.  Patient states his head hurts just a little, pain is 5 out of 10.  Past Medical History:  Diagnosis Date  . BPH (benign prostatic hyperplasia)   . Chronic kidney disease   . Constipation   . Depression   . Diabetes (HCC)   . Dysphagia   . Edema   . GERD (gastroesophageal reflux disease)   . Hypertension   . Hypokalemia   . Iron deficiency anemia   . Osteoporosis   . Pressure ulcer    right heel, stage 3 per  . Stroke (HCC)   . UTI (urinary tract infection)     Patient Active Problem List   Diagnosis Date Noted  . Urinary retention 11/03/2017  . Pressure injury of skin 06/09/2017  . Complicated UTI (urinary tract infection) 06/08/2017    Past Surgical History:  Procedure Laterality Date  . IR CATHETER TUBE CHANGE  06/11/2018  . IR CATHETER TUBE CHANGE  07/09/2018  . SUPRAPUBIC CATHETER INSERTION      Allergies Patient has no known allergies.  Social History Social History   Tobacco Use  . Smoking status: Never Smoker  . Smokeless tobacco: Never Used  Substance Use Topics  . Alcohol use: Never    Frequency: Never  . Drug use: Never   Review of Systems Constitutional: Negative for fever. Cardiovascular: Negative for chest pain. Respiratory: Negative for shortness of breath. Gastrointestinal: Negative for  abdominal pain, vomiting and diarrhea. Musculoskeletal: Negative for back pain. Skin: Positive for extensive left supraorbital laceration. Neurological: Positive for mild headache  All systems negative/normal/unremarkable except as stated in the HPI  ____________________________________________   PHYSICAL EXAM:  VITAL SIGNS: ED Triage Vitals  Enc Vitals Group     BP 07/28/18 1504 (!) 129/57     Pulse Rate 07/28/18 1504 (!) 43     Resp 07/28/18 1509 17     Temp 07/28/18 1504 98.5 F (36.9 C)     Temp Source 07/28/18 1504 Oral     SpO2 07/28/18 1504 98 %     Weight 07/28/18 1511 177 lb (80.3 kg)     Height 07/28/18 1511 5\' 10"  (1.778 m)     Head Circumference --      Peak Flow --      Pain Score 07/28/18 1505 5     Pain Loc --      Pain Edu? --      Excl. in GC? --    Constitutional: Alert, chronically ill-appearing, no obvious distress Eyes: Conjunctivae are normal. Normal extraocular movements. ENT      Head: Normocephalic, large maceration through the left eyebrow      Nose: No congestion/rhinnorhea.      Mouth/Throat: Mucous membranes are moist.      Neck: No stridor. Cardiovascular: Normal rate, regular rhythm. No murmurs, rubs, or gallops. Respiratory: Normal respiratory effort without tachypnea nor retractions. Breath sounds  are clear and equal bilaterally. No wheezes/rales/rhonchi. Gastrointestinal: Soft and nontender. Normal bowel sounds Musculoskeletal: Stiffness of the extremities but unremarkable range of motion Neurologic: Quiet speech, no gross focal neurologic deficits are appreciated.  Skin: 7.5 cm maceration that follows the contour of the left eyebrow, with vertical extension of the laceration as well __________________________________________  ED COURSE:  As part of my medical decision making, I reviewed the following data within the electronic MEDICAL RECORD NUMBER History obtained from family if available, nursing notes, old chart and ekg, as well as  notes from prior ED visits. Patient presented for fall with extensive facial laceration, we will assess with labs and imaging as indicated at this time.   Marland Kitchen..Laceration Repair Date/Time: 07/28/2018 3:27 PM Performed by: Emily FilbertWilliams, Elliott Quade E, MD Authorized by: Emily FilbertWilliams, Mikyle Sox E, MD   Consent:    Consent obtained:  Emergent situation Anesthesia (see MAR for exact dosages):    Anesthesia method:  Local infiltration   Local anesthetic:  Lidocaine 1% WITH epi Laceration details:    Location:  Face   Face location:  L eyebrow   Length (cm):  7.5   Depth (mm):  8 Repair type:    Repair type:  Complex Pre-procedure details:    Preparation:  Patient was prepped and draped in usual sterile fashion Exploration:    Limited defect created (wound extended): no     Contaminated: no   Treatment:    Area cleansed with:  Betadine   Amount of cleaning:  Standard   Irrigation solution:  Sterile saline   Visualized foreign bodies/material removed: no     Debridement:  None Skin repair:    Repair method:  Sutures   Suture size:  4-0   Suture material:  Prolene   Number of sutures:  30 Approximation:    Approximation:  Close Post-procedure details:    Dressing:  Adhesive bandage   Patient tolerance of procedure:  Tolerated well, no immediate complications    Dustin Glazierrthur Ducey was evaluated in Emergency Department on 07/28/2018 for the symptoms described in the history of present illness. He was evaluated in the context of the global COVID-19 pandemic, which necessitated consideration that the patient might be at risk for infection with the SARS-CoV-2 virus that causes COVID-19. Institutional protocols and algorithms that pertain to the evaluation of patients at risk for COVID-19 are in a state of rapid change based on information released by regulatory bodies including the CDC and federal and state organizations. These policies and algorithms were followed during the patient's care in the ED.   ____________________________________________   LABS (pertinent positives/negatives)  Labs Reviewed  CBC WITH DIFFERENTIAL/PLATELET - Abnormal; Notable for the following components:      Result Value   WBC 17.3 (*)    Neutro Abs 11.7 (*)    Lymphs Abs 4.3 (*)    Abs Immature Granulocytes 0.10 (*)    All other components within normal limits  COMPREHENSIVE METABOLIC PANEL - Abnormal; Notable for the following components:   Glucose, Bld 158 (*)    BUN 37 (*)    Creatinine, Ser 2.14 (*)    Calcium 8.3 (*)    GFR calc non Af Amer 27 (*)    GFR calc Af Amer 31 (*)    All other components within normal limits  URINALYSIS, COMPLETE (UACMP) WITH MICROSCOPIC - Abnormal; Notable for the following components:   Color, Urine YELLOW (*)    APPearance CLOUDY (*)    Hgb urine dipstick MODERATE (*)  Protein, ur 100 (*)    Leukocytes,Ua LARGE (*)    WBC, UA >50 (*)    Bacteria, UA MANY (*)    All other components within normal limits    RADIOLOGY Images were viewed by me  CT head, maxillofacial, CT C-spine IMPRESSION: 1. No acute intracranial abnormality. 2. Small left frontal scalp laceration and left periorbital soft tissue hematoma. No facial fracture. 3. No acute fracture or static subluxation of the cervical spine.  X-rays of the chest and pelvis look normal ____________________________________________   DIFFERENTIAL DIAGNOSIS   Fall, laceration, subdural, skull fracture, dehydration, electrolyte abnormality, occult infection  FINAL ASSESSMENT AND PLAN  Fall, head injury, complex facial laceration, UTI   Plan: The patient had presented for unwitnessed fall. Patient's labs indicate some acute on chronic renal insufficiency and a urinary tract infection. Patient's imaging was negative for any acute process.  He was here for an unwitnessed fall and his laceration was repaired as dictated above.  He was given IV Rocephin for his urine and we have sent a urine culture.  He will  be discharged with Cipro and is cleared for outpatient follow-up.   Laurence Aly, MD    Note: This note was generated in part or whole with voice recognition software. Voice recognition is usually quite accurate but there are transcription errors that can and very often do occur. I apologize for any typographical errors that were not detected and corrected.     Earleen Newport, MD 07/28/18 [phone]

## 2018-07-28 NOTE — ED Triage Notes (Signed)
Pt arrives from [facility] after an unwitnessed fall- pt had an unknown downtime- pt denies LOC- pt has a lac to his forehead and left eye is swollen- states his head hurts just a little

## 2018-07-28 NOTE — ED Notes (Signed)
Spoke with [facility] and gave report and had Network engineer call EMS

## 2018-07-28 NOTE — ED Notes (Addendum)
Came out of another room and saw pt pulling on BP cord trying to disconnect it- asked pt what he was doing and he stated he wanted to go- after telling him that he needed to see the doctor first he said okay and let go- head wound was bleeding a little, but had stopped- head sounds cleaned off- pt has nonslip socks on and door remains open to keep visual on pt

## 2018-07-28 NOTE — ED Notes (Signed)
[facility]  CALLED  FOR  TRANSPORT 

## 2018-07-28 NOTE — ED Notes (Addendum)
Patient placed on bed alarm because previous shift stated he needed one until EMS comes to bring back to [facility].

## 2018-07-31 LAB — URINE CULTURE
Culture: >=100000 — AB
Special Requests: NORMAL

## 2018-08-01 NOTE — Consult Note (Signed)
[facility] home for get status of patient. Nursing home gave me Dr. Tamala Julian (primary care doctor for patient) [phone]) as I was not allow to speak to the patient. I called Dr. Tamala Julian and he is going to start Ertapenem 1 g IM for the patient, once he was informed about the culture results. Patient's daughter was called and updated. Ucx grew ESBL Klebsiella. [facility] that if patient has any fevers or symptoms of illness to bring patient to the ED. Pt also has a sub pubic catheter. Case was discussed with Dr. Archie Balboa.   Thanks,   Eleonore Chiquito, PharmD, BCPS

## 2018-08-10 ENCOUNTER — Ambulatory Visit (INDEPENDENT_AMBULATORY_CARE_PROVIDER_SITE_OTHER): Payer: Medicare Other | Admitting: Urology

## 2018-08-10 ENCOUNTER — Other Ambulatory Visit: Payer: Self-pay

## 2018-08-10 ENCOUNTER — Encounter: Payer: Self-pay | Admitting: Urology

## 2018-08-10 VITALS — BP 130/70 | HR 80

## 2018-08-10 DIAGNOSIS — R339 Retention of urine, unspecified: Secondary | ICD-10-CM | POA: Diagnosis not present

## 2018-08-10 NOTE — Progress Notes (Signed)
Suprapubic Cath Change  Patient is present today for a suprapubic catheter change due to urinary retention.  10 ml of water was drained from the balloon, a 16 FR foley cath was removed from the tract with out difficulty.  Site was cleaned and prepped in a sterile fashion with betadine.  A 16 FR foley cath was replaced into the tract no complications were noted. Urine return was noted, 10 ml of sterile water was inflated into the balloon and a standard bag was attached for drainage.  Patient tolerated well.    Preformed by: John Giovanni, MD  Follow up: 6 months

## 2018-08-20 ENCOUNTER — Other Ambulatory Visit: Payer: Self-pay

## 2018-08-20 ENCOUNTER — Emergency Department: Payer: Medicare Other

## 2018-08-20 ENCOUNTER — Inpatient Hospital Stay
Admission: EM | Admit: 2018-08-20 | Discharge: 2018-08-24 | DRG: 193 | Disposition: A | Discharge status: Skilled Nursing Facility | Payer: Medicare Other | Attending: Internal Medicine | Admitting: Internal Medicine

## 2018-08-20 DIAGNOSIS — Z79899 Other long term (current) drug therapy: Secondary | ICD-10-CM

## 2018-08-20 DIAGNOSIS — I441 Atrioventricular block, second degree: Secondary | ICD-10-CM | POA: Diagnosis present

## 2018-08-20 DIAGNOSIS — N183 Chronic kidney disease, stage 3 (moderate): Secondary | ICD-10-CM | POA: Diagnosis present

## 2018-08-20 DIAGNOSIS — J9601 Acute respiratory failure with hypoxia: Secondary | ICD-10-CM | POA: Diagnosis present

## 2018-08-20 DIAGNOSIS — D631 Anemia in chronic kidney disease: Secondary | ICD-10-CM | POA: Diagnosis present

## 2018-08-20 DIAGNOSIS — Z66 Do not resuscitate: Secondary | ICD-10-CM | POA: Diagnosis present

## 2018-08-20 DIAGNOSIS — I959 Hypotension, unspecified: Secondary | ICD-10-CM | POA: Diagnosis present

## 2018-08-20 DIAGNOSIS — R1310 Dysphagia, unspecified: Secondary | ICD-10-CM | POA: Diagnosis present

## 2018-08-20 DIAGNOSIS — Z79891 Long term (current) use of opiate analgesic: Secondary | ICD-10-CM

## 2018-08-20 DIAGNOSIS — N400 Enlarged prostate without lower urinary tract symptoms: Secondary | ICD-10-CM | POA: Diagnosis present

## 2018-08-20 DIAGNOSIS — Z87440 Personal history of urinary (tract) infections: Secondary | ICD-10-CM | POA: Diagnosis not present

## 2018-08-20 DIAGNOSIS — R001 Bradycardia, unspecified: Secondary | ICD-10-CM | POA: Diagnosis present

## 2018-08-20 DIAGNOSIS — E860 Dehydration: Secondary | ICD-10-CM | POA: Diagnosis present

## 2018-08-20 DIAGNOSIS — Z7982 Long term (current) use of aspirin: Secondary | ICD-10-CM | POA: Diagnosis not present

## 2018-08-20 DIAGNOSIS — J189 Pneumonia, unspecified organism: Principal | ICD-10-CM | POA: Diagnosis present

## 2018-08-20 DIAGNOSIS — I080 Rheumatic disorders of both mitral and aortic valves: Secondary | ICD-10-CM | POA: Diagnosis present

## 2018-08-20 DIAGNOSIS — Z794 Long term (current) use of insulin: Secondary | ICD-10-CM | POA: Diagnosis not present

## 2018-08-20 DIAGNOSIS — M810 Age-related osteoporosis without current pathological fracture: Secondary | ICD-10-CM | POA: Diagnosis present

## 2018-08-20 DIAGNOSIS — I129 Hypertensive chronic kidney disease with stage 1 through stage 4 chronic kidney disease, or unspecified chronic kidney disease: Secondary | ICD-10-CM | POA: Diagnosis present

## 2018-08-20 DIAGNOSIS — K219 Gastro-esophageal reflux disease without esophagitis: Secondary | ICD-10-CM | POA: Diagnosis present

## 2018-08-20 DIAGNOSIS — Z1159 Encounter for screening for other viral diseases: Secondary | ICD-10-CM

## 2018-08-20 DIAGNOSIS — G9341 Metabolic encephalopathy: Secondary | ICD-10-CM | POA: Diagnosis present

## 2018-08-20 DIAGNOSIS — I4892 Unspecified atrial flutter: Secondary | ICD-10-CM | POA: Diagnosis not present

## 2018-08-20 DIAGNOSIS — Z9359 Other cystostomy status: Secondary | ICD-10-CM

## 2018-08-20 DIAGNOSIS — F329 Major depressive disorder, single episode, unspecified: Secondary | ICD-10-CM | POA: Diagnosis present

## 2018-08-20 DIAGNOSIS — Z8673 Personal history of transient ischemic attack (TIA), and cerebral infarction without residual deficits: Secondary | ICD-10-CM

## 2018-08-20 DIAGNOSIS — E1122 Type 2 diabetes mellitus with diabetic chronic kidney disease: Secondary | ICD-10-CM | POA: Diagnosis present

## 2018-08-20 LAB — SARS CORONAVIRUS 2 BY RT PCR (HOSPITAL ORDER, PERFORMED IN ~~LOC~~ HOSPITAL LAB): SARS Coronavirus 2: NEGATIVE

## 2018-08-20 LAB — CBC WITH DIFFERENTIAL/PLATELET
Abs Immature Granulocytes: 0.04 10*3/uL (ref 0.00–0.07)
Basophils Absolute: 0.1 10*3/uL (ref 0.0–0.1)
Basophils Relative: 1 %
Eosinophils Absolute: 0.3 10*3/uL (ref 0.0–0.5)
Eosinophils Relative: 3 %
HCT: 37.6 % — ABNORMAL LOW (ref 39.0–52.0)
Hemoglobin: 11.6 g/dL — ABNORMAL LOW (ref 13.0–17.0)
Immature Granulocytes: 0 %
Lymphocytes Relative: 51 %
Lymphs Abs: 5.5 10*3/uL — ABNORMAL HIGH (ref 0.7–4.0)
MCH: 28.4 pg (ref 26.0–34.0)
MCHC: 30.9 g/dL (ref 30.0–36.0)
MCV: 92.2 fL (ref 80.0–100.0)
Monocytes Absolute: 0.8 10*3/uL (ref 0.1–1.0)
Monocytes Relative: 8 %
Neutro Abs: 3.9 10*3/uL (ref 1.7–7.7)
Neutrophils Relative %: 37 %
Platelets: 197 10*3/uL (ref 150–400)
RBC: 4.08 MIL/uL — ABNORMAL LOW (ref 4.22–5.81)
RDW: 14.8 % (ref 11.5–15.5)
WBC: 10.6 10*3/uL — ABNORMAL HIGH (ref 4.0–10.5)
nRBC: 0 % (ref 0.0–0.2)

## 2018-08-20 LAB — URINALYSIS, COMPLETE (UACMP) WITH MICROSCOPIC
Bacteria, UA: NONE SEEN
Bilirubin Urine: NEGATIVE
Glucose, UA: NEGATIVE mg/dL
Hgb urine dipstick: NEGATIVE
Ketones, ur: NEGATIVE mg/dL
Nitrite: NEGATIVE
Protein, ur: 30 mg/dL — AB
Specific Gravity, Urine: 1.014 (ref 1.005–1.030)
Squamous Epithelial / HPF: NONE SEEN (ref 0–5)
WBC, UA: >50 WBC/hpf — ABNORMAL HIGH (ref 0–5)
pH: 5 (ref 5.0–8.0)

## 2018-08-20 LAB — COMPREHENSIVE METABOLIC PANEL
ALT: 12 U/L (ref 0–44)
AST: 21 U/L (ref 15–41)
Albumin: 3.2 g/dL — ABNORMAL LOW (ref 3.5–5.0)
Alkaline Phosphatase: 60 U/L (ref 38–126)
Anion gap: 11 (ref 5–15)
BUN: 32 mg/dL — ABNORMAL HIGH (ref 8–23)
CO2: 22 mmol/L (ref 22–32)
Calcium: 8.2 mg/dL — ABNORMAL LOW (ref 8.9–10.3)
Chloride: 108 mmol/L (ref 98–111)
Creatinine, Ser: 1.75 mg/dL — ABNORMAL HIGH (ref 0.61–1.24)
GFR calc Af Amer: 39 mL/min — ABNORMAL LOW (ref >60)
GFR calc non Af Amer: 34 mL/min — ABNORMAL LOW (ref >60)
Glucose, Bld: 93 mg/dL (ref 70–99)
Potassium: 4.1 mmol/L (ref 3.5–5.1)
Sodium: 141 mmol/L (ref 135–145)
Total Bilirubin: 0.4 mg/dL (ref 0.3–1.2)
Total Protein: 6.9 g/dL (ref 6.5–8.1)

## 2018-08-20 LAB — APTT: aPTT: 35 seconds (ref 24–36)

## 2018-08-20 LAB — LACTIC ACID, PLASMA: Lactic Acid, Venous: 1.6 mmol/L (ref 0.5–1.9)

## 2018-08-20 LAB — MAGNESIUM: Magnesium: 1.9 mg/dL (ref 1.7–2.4)

## 2018-08-20 LAB — PROTIME-INR
INR: 1 (ref 0.8–1.2)
Prothrombin Time: 13.5 seconds (ref 11.4–15.2)

## 2018-08-20 MED ORDER — HEPARIN (PORCINE) 25000 UT/250ML-% IV SOLN
1200.0000 [IU]/h | INTRAVENOUS | Status: DC
  Administered 2018-08-21 (×2): 1200 [IU]/h via INTRAVENOUS
  Filled 2018-08-20 (×2): qty 250

## 2018-08-20 MED ORDER — DILTIAZEM HCL 100 MG IV SOLR
INTRAVENOUS | Status: AC
  Filled 2018-08-20: qty 100

## 2018-08-20 MED ORDER — MAGNESIUM SULFATE 4 GM/100ML IV SOLN
INTRAVENOUS | Status: AC
  Filled 2018-08-20: qty 100

## 2018-08-20 MED ORDER — MAGNESIUM SULFATE 4 GM/100ML IV SOLN: 4.0000 g | Freq: Once | INTRAVENOUS | Status: DC

## 2018-08-20 MED ORDER — SODIUM CHLORIDE 0.9 % IV BOLUS
500.0000 mL | Freq: Once | INTRAVENOUS | Status: AC
  Administered 2018-08-21: 500 mL via INTRAVENOUS

## 2018-08-20 MED ORDER — DILTIAZEM HCL 100 MG IV SOLR: 5.0000 mg/h | INTRAVENOUS | Status: DC

## 2018-08-20 MED ORDER — SODIUM CHLORIDE 0.9 % IV SOLN
500.0000 mg | INTRAVENOUS | Status: DC
  Administered 2018-08-20: 500 mg via INTRAVENOUS
  Filled 2018-08-20: qty 500

## 2018-08-20 MED ORDER — SODIUM CHLORIDE 0.9 % IV SOLN
1000.0000 mL | Freq: Once | INTRAVENOUS | Status: AC
  Administered 2018-08-20: 1000 mL via INTRAVENOUS

## 2018-08-20 MED ORDER — DILTIAZEM HCL 25 MG/5ML IV SOLN
10.0000 mg | Freq: Once | INTRAVENOUS | Status: AC
  Administered 2018-08-20: 10 mg via INTRAVENOUS

## 2018-08-20 MED ORDER — SODIUM CHLORIDE 0.9 % IV SOLN
INTRAVENOUS | Status: AC
  Administered 2018-08-20 – 2018-08-21 (×3): via INTRAVENOUS

## 2018-08-20 MED ORDER — SODIUM CHLORIDE 0.9 % IV SOLN
2.0000 g | INTRAVENOUS | Status: DC
  Administered 2018-08-20 – 2018-08-23 (×4): 2 g via INTRAVENOUS
  Filled 2018-08-20: qty 2
  Filled 2018-08-20: qty 20
  Filled 2018-08-20: qty 2
  Filled 2018-08-20 (×2): qty 20

## 2018-08-20 MED ORDER — HEPARIN BOLUS VIA INFUSION
4000.0000 [IU] | Freq: Once | INTRAVENOUS | Status: AC
  Administered 2018-08-21: 4000 [IU] via INTRAVENOUS
  Filled 2018-08-20: qty 4000

## 2018-08-20 MED ORDER — AMIODARONE IV BOLUS ONLY 150 MG/100ML
INTRAVENOUS | Status: AC
  Filled 2018-08-20: qty 100

## 2018-08-20 MED ORDER — MAGNESIUM SULFATE 2 GM/50ML IV SOLN
INTRAVENOUS | Status: AC
  Filled 2018-08-20: qty 50

## 2018-08-20 NOTE — ED Notes (Signed)
ED TO INPATIENT HANDOFF REPORT  ED Nurse Name and Phone #:  [phone]  S Name/Age/Gender Dustin GlazierArthur Hicks 85 y.o. male Room/Bed: ED11A/ED11A  Code Status   Code Status: Prior  Home/SNF/Other Skilled nursing facility Patient oriented to: self Is this baseline? No   Triage Complete: Triage complete  Chief Complaint Altered Level of Consciousness  Triage Note Patient is short of breath, bradycardic, and hypotensive.    Allergies No Known Allergies  Level of Care/Admitting Diagnosis ED Disposition    ED Disposition Condition Comment   Admit  [facility]ospital [facility] [[id number]]  Level of Care: Telemetry [5]  Covid Evaluation: Confirmed COVID Negative  Diagnosis: Pneumonia [227785]  Admitting Physician: Houston SirenSAINANI, VIVEK J [[id number]]  Attending Physician: Houston SirenSAINANI, VIVEK J [[id number]]  Estimated length of stay: past midnight tomorrow  Certification:: I certify this patient will need inpatient services for at least 2 midnights  PT Class (Do Not Modify): Inpatient [101]  PT Acc Code (Do Not Modify): Private [1]       B Medical/Surgery History Past Medical History:  Diagnosis Date  . BPH (benign prostatic hyperplasia)   . Chronic kidney disease   . Constipation   . Depression   . Diabetes (HCC)   . Dysphagia   . Edema   . GERD (gastroesophageal reflux disease)   . Hypertension   . Hypokalemia   . Iron deficiency anemia   . Osteoporosis   . Pressure ulcer    right heel, stage 3 per  . Stroke (HCC)   . UTI (urinary tract infection)    Past Surgical History:  Procedure Laterality Date  . IR CATHETER TUBE CHANGE  06/11/2018  . IR CATHETER TUBE CHANGE  07/09/2018  . SUPRAPUBIC CATHETER INSERTION       A IV Location/Drains/Wounds Patient Lines/Drains/Airways Status   Active Line/Drains/Airways    Name:   Placement date:   Placement time:   Site:   Days:   Peripheral IV 08/20/18 Right Wrist   08/20/18    1909    Wrist   less than 1    Peripheral IV 08/20/18 Left Wrist   08/20/18    1910    Wrist   less than 1   Suprapubic Catheter Latex 16 Fr.   07/09/18    1120    Latex   42   Pressure Injury 06/08/17 Unstageable - Full thickness tissue loss in which the base of the ulcer is covered by slough (yellow, tan, gray, green or brown) and/or eschar (tan, brown or black) in the wound bed.   06/08/17    2120     438   Pressure Injury 06/08/17 Stage II -  Partial thickness loss of dermis presenting as a shallow open ulcer with a red, pink wound bed without slough.   06/08/17    2120     438          Intake/Output Last 24 hours  Intake/Output Summary (Last 24 hours) at 08/20/2018 2148 Last data filed at 08/20/2018 2055 Gross per 24 hour  Intake 1100 ml  Output -  Net 1100 ml    Labs/Imaging Results for orders placed or performed during the hospital encounter of 08/20/18 (from the past 48 hour(s))  Lactic acid, plasma     Status: None   Collection Time: 08/20/18  7:19 PM  Result Value Ref Range   Lactic Acid, Venous 1.6 0.5 - 1.9 mmol/L    Comment: Performed at [facility], [address] [place], [place] [address]  Comprehensive metabolic panel     Status: Abnormal   Collection Time: 08/20/18  7:19 PM  Result Value Ref Range   Sodium 141 135 - 145 mmol/L   Potassium 4.1 3.5 - 5.1 mmol/L   Chloride 108 98 - 111 mmol/L   CO2 22 22 - 32 mmol/L   Glucose, Bld 93 70 - 99 mg/dL   BUN 32 (H) 8 - 23 mg/dL   Creatinine, Ser 6.041.75 (H) 0.61 - 1.24 mg/dL   Calcium 8.2 (L) 8.9 - 10.3 mg/dL   Total Protein 6.9 6.5 - 8.1 g/dL   Albumin 3.2 (L) 3.5 - 5.0 g/dL   AST 21 15 - 41 U/L   ALT 12 0 - 44 U/L   Alkaline Phosphatase 60 38 - 126 U/L   Total Bilirubin 0.4 0.3 - 1.2 mg/dL   GFR calc non Af Amer 34 (L) >60 mL/min   GFR calc Af Amer 39 (L) >60 mL/min   Anion gap 11 5 - 15    Comment: Performed at [facility], [address] [place], [place] [address]  CBC WITH DIFFERENTIAL     Status: Abnormal    Collection Time: 08/20/18  7:19 PM  Result Value Ref Range   WBC 10.6 (H) 4.0 - 10.5 K/uL   RBC 4.08 (L) 4.22 - 5.81 MIL/uL   Hemoglobin 11.6 (L) 13.0 - 17.0 g/dL   HCT 11.937.6 (L) 14.739.0 - 82.952.0 %   MCV 92.2 80.0 - 100.0 fL   MCH 28.4 26.0 - 34.0 pg   MCHC 30.9 30.0 - 36.0 g/dL   RDW 56.214.8 13.011.5 - 86.515.5 %   Platelets 197 150 - 400 K/uL   nRBC 0.0 0.0 - 0.2 %   Neutrophils Relative % 37 %   Neutro Abs 3.9 1.7 - 7.7 K/uL   Lymphocytes Relative 51 %   Lymphs Abs 5.5 (H) 0.7 - 4.0 K/uL   Monocytes Relative 8 %   Monocytes Absolute 0.8 0.1 - 1.0 K/uL   Eosinophils Relative 3 %   Eosinophils Absolute 0.3 0.0 - 0.5 K/uL   Basophils Relative 1 %   Basophils Absolute 0.1 0.0 - 0.1 K/uL   Immature Granulocytes 0 %   Abs Immature Granulocytes 0.04 0.00 - 0.07 K/uL    Comment: Performed at [facility], [address] [place], [place] [address]  Urinalysis, Complete w Microscopic     Status: Abnormal   Collection Time: 08/20/18  7:19 PM  Result Value Ref Range   Color, Urine YELLOW (A) YELLOW   APPearance HAZY (A) CLEAR   Specific Gravity, Urine 1.014 1.005 - 1.030   pH 5.0 5.0 - 8.0   Glucose, UA NEGATIVE NEGATIVE mg/dL   Hgb urine dipstick NEGATIVE NEGATIVE   Bilirubin Urine NEGATIVE NEGATIVE   Ketones, ur NEGATIVE NEGATIVE mg/dL   Protein, ur 30 (A) NEGATIVE mg/dL   Nitrite NEGATIVE NEGATIVE   Leukocytes,Ua LARGE (A) NEGATIVE   RBC / HPF 6-10 0 - 5 RBC/hpf   WBC, UA >50 (H) 0 - 5 WBC/hpf   Bacteria, UA NONE SEEN NONE SEEN   Squamous Epithelial / LPF NONE SEEN 0 - 5   Mucus PRESENT    Budding Yeast PRESENT    Hyaline Casts, UA PRESENT     Comment: Performed at [facility], [address] [place], [place] [address]  SARS Coronavirus 2 (CEPHEID - Performed in [facility] hospital lab), [facility] Order  Status: None   Collection Time: 08/20/18  7:19 PM   Specimen: Nasopharyngeal Swab  Result Value Ref Range   SARS Coronavirus 2 NEGATIVE NEGATIVE    Comment:  (NOTE) If result is NEGATIVE SARS-CoV-2 target nucleic acids are NOT DETECTED. The SARS-CoV-2 RNA is generally detectable in upper and lower  respiratory specimens during the acute phase of infection. The lowest  concentration of SARS-CoV-2 viral copies this assay can detect is 250  copies / mL. A negative result does not preclude SARS-CoV-2 infection  and should not be used as the sole basis for treatment or other  patient management decisions.  A negative result may occur with  improper specimen collection / handling, submission of specimen other  than nasopharyngeal swab, presence of viral mutation(s) within the  areas targeted by this assay, and inadequate number of viral copies  (<250 copies / mL). A negative result must be combined with clinical  observations, patient history, and epidemiological information. If result is POSITIVE SARS-CoV-2 target nucleic acids are DETECTED. The SARS-CoV-2 RNA is generally detectable in upper and lower  respiratory specimens dur ing the acute phase of infection.  Positive  results are indicative of active infection with SARS-CoV-2.  Clinical  correlation with patient history and other diagnostic information is  necessary to determine patient infection status.  Positive results do  not rule out bacterial infection or co-infection with other viruses. If result is PRESUMPTIVE POSTIVE SARS-CoV-2 nucleic acids MAY BE PRESENT.   A presumptive positive result was obtained on the submitted specimen  and confirmed on repeat testing.  While 2019 novel coronavirus  (SARS-CoV-2) nucleic acids may be present in the submitted sample  additional confirmatory testing may be necessary for epidemiological  and / or clinical management purposes  to differentiate between  SARS-CoV-2 and other Sarbecovirus currently known to infect humans.  If clinically indicated additional testing with an alternate test  methodology [id number]) is advised. The SARS-CoV-2 RNA is  generally  detectable in upper and lower respiratory sp ecimens during the acute  phase of infection. The expected result is Negative. Fact Sheet for Patients:  StrictlyIdeas.no Fact Sheet for Healthcare Providers: BankingDealers.co.za This test is not yet approved or cleared by the [place] FDA and has been authorized for detection and/or diagnosis of SARS-CoV-2 by FDA under an Emergency Use Authorization (EUA).  This EUA will remain in effect (meaning this test can be used) for the duration of the COVID-19 declaration under Section 564(b)(1) of the Act, 21 U.S.C. section 360bbb-3(b)(1), unless the authorization is terminated or revoked sooner. Performed at [facility], [address] [place], [place] [address]   Magnesium     Status: None   Collection Time: 08/20/18  7:19 PM  Result Value Ref Range   Magnesium 1.9 1.7 - 2.4 mg/dL    Comment: Performed at [facility], [address] [place], [place] [address]   Dg Chest Port 1 View  Result Date: 08/20/2018 CLINICAL DATA:  Altered mental status EXAM: PORTABLE CHEST 1 VIEW COMPARISON:  07/28/2018, 06/08/2017 FINDINGS: Right lung is grossly clear. Enlarged cardiomediastinal silhouette. Suspected airspace disease at the lingula and left base, not significantly changed. Aortic atherosclerosis. No pneumothorax. IMPRESSION: 1. Suspected airspace disease at the lingula and left base which may reflect atelectasis or pneumonia 2. Mild cardiomegaly Electronically Signed   By: Donavan Foil M.D.   On: 08/20/2018 19:35    Pending Labs [employer] (From admission, onward)    Start  Ordered   08/20/18 1910  Blood Culture (routine x 2)  BLOOD CULTURE X 2,   STAT     08/20/18 1909   08/20/18 1910  Urine culture  ONCE - STAT,   STAT     08/20/18 1909   Signed and Held  Basic metabolic panel  Tomorrow morning,   R     Signed and Held   Signed and Held  CBC  Tomorrow  morning,   R     Signed and Held   Signed and Held  CBC  (heparin)  Once,   R    Comments: Baseline for heparin therapy IF NOT ALREADY DRAWN.  Notify MD if PLT < 100 K.    Signed and Held   Signed and Held  Creatinine, serum  (heparin)  Once,   R    Comments: Baseline for heparin therapy IF NOT ALREADY DRAWN.    Signed and Held          Vitals/Pain Today's Vitals   08/20/18 2000 08/20/18 2030 08/20/18 2100 08/20/18 2130  BP: (!) 115/40 (!) 124/49 (!) 128/45 (!) 102/46  Pulse: (!) 36 (!) 103 79 (!) 45  Resp: 14 14 14 13   Temp:      SpO2: 100% 99% 99% 93%  Weight:      Height:        Isolation Precautions No active isolations  Medications Medications  cefTRIAXone (ROCEPHIN) 2 g in sodium chloride 0.9 % 100 mL IVPB (0 g Intravenous Stopped 08/20/18 2055)  azithromycin (ZITHROMAX) 500 mg in sodium chloride 0.9 % 250 mL IVPB (500 mg Intravenous New Bag/Given 08/20/18 2101)  0.9 %  sodium chloride infusion (0 mLs Intravenous Stopped 08/20/18 2025)  diltiazem (CARDIZEM) injection 10 mg (10 mg Intravenous Given 08/20/18 2120)    Mobility non-ambulatory High fall risk   Focused Assessments Cardiac Assessment Handoff:  Cardiac Rhythm: Sinus bradycardia, Atrial flutter Lab Results  Component Value Date   CKTOTAL 30 (L) 06/08/2017   No results found for: DDIMER Does the Patient currently have chest pain? No  , Pulmonary Assessment Handoff:  Lung sounds:   O2 Device: Room Air        R Recommendations: See Admitting Provider Note  Report given to:   Additional Notes:

## 2018-08-20 NOTE — H&P (Signed)
[employer] [facility] [place] [facility]    PATIENT NAME: Dustin Hicks    MR#:  [id number]  DATE OF BIRTH:  11/05/1928  DATE OF ADMISSION:  08/20/2018  PRIMARY CARE PHYSICIAN: Patient, No Pcp Per   REQUESTING/REFERRING PHYSICIAN: Dr. Jene Everyobert Kinner  CHIEF COMPLAINT:   Chief Complaint  Patient presents with  . Shortness of Breath    HISTORY OF PRESENT ILLNESS:  Dustin Hicks  is a 85 y.o. male with a known history of BPH, chronic kidney disease stage III, diabetes, GERD, hypertension, osteoporosis,, previous CVA, previous history of ESBL UTI who presents to the hospital due to weakness, altered mental status.  Patient is a very poor historian therefore most of the history obtained from the ER physician, chart and also speaking to the daughter.  Patient apparently was in his usual state of health and today was found to be less responsive lethargic and therefore sent to the ER for further evaluation.  When patient presented to the ER he was noted to be bradycardic, and slightly hypotensive which resolved with some IV fluids.  Patient's mental status also improved with some IV fluid hydration here in the ER.  Patient also in the ER was then noted to be in atrial flutter with very variable rates.  Patient's urinalysis is still positive but he was recently treated for ESBL UTI with a week course of IV meropenem and incidentally on chest x-ray also noted to have suspected pneumonia.  Hospitalist services were contacted for admission.  PAST MEDICAL HISTORY:   Past Medical History:  Diagnosis Date  . BPH (benign prostatic hyperplasia)   . Chronic kidney disease   . Constipation   . Depression   . Diabetes (HCC)   . Dysphagia   . Edema   . GERD (gastroesophageal reflux disease)   . Hypertension   . Hypokalemia   . Iron deficiency anemia   . Osteoporosis   . Pressure ulcer    right heel, stage 3 per  . Stroke (HCC)   . UTI (urinary tract infection)     PAST SURGICAL  HISTORY:   Past Surgical History:  Procedure Laterality Date  . IR CATHETER TUBE CHANGE  06/11/2018  . IR CATHETER TUBE CHANGE  07/09/2018  . SUPRAPUBIC CATHETER INSERTION      SOCIAL HISTORY:   Social History   Tobacco Use  . Smoking status: Never Smoker  . Smokeless tobacco: Never Used  Substance Use Topics  . Alcohol use: Never    Frequency: Never    FAMILY HISTORY:  No family history on file.  DRUG ALLERGIES:  No Known Allergies  REVIEW OF SYSTEMS:   Review of Systems  Unable to perform ROS: Dementia    MEDICATIONS AT HOME:   Prior to Admission medications   Medication Sig Start Date End Date Taking? Authorizing Provider  acetaminophen (TYLENOL) 325 MG tablet Take 650 mg by mouth every 4 (four) hours as needed for mild pain or fever.   Yes [provider]  acetaminophen (TYLENOL) 500 MG tablet Take 1,000 mg by mouth every 8 (eight) hours as needed for moderate pain.    Yes [provider]  acetic acid 0.25 % irrigation Irrigate with 1 application as directed at bedtime.   Yes [provider]  aspirin 81 MG chewable tablet Chew 81 mg by mouth daily.   Yes [provider]  calcium carbonate (TUMS - DOSED IN MG ELEMENTAL CALCIUM) 500 MG chewable tablet Chew 1 tablet by mouth  daily.   Yes [provider]  Cholecalciferol 25 MCG (1000 UT) capsule Take 1,000 Units by mouth daily.   Yes [provider]  citalopram (CELEXA) 20 MG tablet Take 20 mg by mouth daily.   Yes [provider]  dutasteride (AVODART) 0.5 MG capsule Take 0.5 mg by mouth daily.   Yes [provider]  ferrous sulfate 220 (44 Fe) MG/5ML solution Take 330 mg by mouth daily.   Yes [provider]  furosemide (LASIX) 20 MG tablet Take 20 mg by mouth daily.    Yes [provider]  guaiFENesin (ROBITUSSIN) 100 MG/5ML SOLN Take 10 mLs by mouth every 4 (four) hours as needed for cough or to loosen phlegm.   Yes [provider]  insulin glargine (LANTUS) 100 UNIT/ML injection Inject 0.1 mLs (10 Units total) into the skin at bedtime. Patient taking differently: Inject 5 Units into the skin at bedtime.  06/12/17  Yes [place] SirenSainani, Jayvin Hurrell J, MD  metoprolol succinate (TOPROL-XL) 25 MG 24 hr tablet Take 12.5 mg by mouth daily.    Yes [provider]  pantoprazole (PROTONIX) 40 MG tablet Take 40 mg by mouth daily.    Yes [provider]  polyethylene glycol (MIRALAX / GLYCOLAX) packet Take 17 g by mouth daily as needed for mild constipation. 06/12/17  Yes Tijah Hane, Rolly PancakeVivek J, MD  potassium chloride (K-DUR) 10 MEQ tablet Take 10 mEq by mouth daily.   Yes [provider]  QUEtiapine (SEROQUEL) 25 MG tablet Take 12.5 mg by mouth at bedtime.    Yes [provider]  Throat Lozenges (COUGH DROPS MENTHOL) LOZG Use as directed 1 lozenge in the mouth or throat 4 (four) times daily as needed (sore throat).   Yes [provider]  traMADol (ULTRAM) 50 MG tablet Take 1 tablet (50 mg total) by mouth 3 (three) times daily as needed for moderate pain. 06/12/17  Yes [place] SirenSainani, [place] J, MD  traZODone (DESYREL) 50 MG tablet Take 50 mg by mouth every evening.   Yes [provider]  zinc oxide 20 % ointment Apply 1 application topically as needed for irritation. Xerosis cutis   Yes [provider]      VITAL SIGNS:  Blood pressure (!) 102/46, pulse (!) 45, temperature 98.1 F (36.7 C), resp. rate 13, height 6' (1.829 m), weight 85 kg, SpO2 93 %.  PHYSICAL EXAMINATION:  Physical Exam  GENERAL:  85 y.o.-year-old patient lying in the bed lethargic but in NAD.   EYES: Pupils equal, round, reactive to light and accommodation. No scleral icterus. Extraocular muscles intact.  HEENT: Head atraumatic, normocephalic. Oropharynx and nasopharynx clear. No oropharyngeal erythema, moist oral mucosa  NECK:  Supple, no jugular venous distention. No thyroid enlargement, no tenderness.  LUNGS:  Normal breath sounds bilaterally, no wheezing, rales, rhonchi. No use of accessory muscles of respiration.  CARDIOVASCULAR: S1, S2 RRR. No murmurs, rubs, gallops, clicks.  ABDOMEN: Soft, nontender, nondistended. Bowel sounds present. No organomegaly or mass.  EXTREMITIES: No pedal edema, cyanosis, or clubbing. + 2 pedal & radial pulses b/l.   NEUROLOGIC: Cranial nerves II through XII are intact. No focal Motor or sensory deficits appreciated b/l. Hx of previous CVA and pt. Lying in a contracted position.  PSYCHIATRIC: The patient is alert and oriented x 1.  SKIN: No obvious rash, lesion, or ulcer.   LABORATORY PANEL:   CBC Recent Labs  Lab 08/20/18 1919  WBC 10.6*  HGB 11.6*  HCT 37.6*  PLT 197   ------------------------------------------------------------------------------------------------------------------  Chemistries  Recent Labs  Lab 08/20/18 1919  NA 141  K 4.1  CL 108  CO2 22  GLUCOSE 93  BUN 32*  CREATININE 1.75*  CALCIUM 8.2*  MG 1.9  AST 21  ALT 12  ALKPHOS 60  BILITOT 0.4   ------------------------------------------------------------------------------------------------------------------  Cardiac Enzymes No results for input(s): TROPONINI in the last 168 hours. ------------------------------------------------------------------------------------------------------------------  RADIOLOGY:  Dg Chest Port 1 View  Result Date: 08/20/2018 CLINICAL DATA:  Altered mental status EXAM: PORTABLE CHEST 1 VIEW COMPARISON:  07/28/2018, 06/08/2017 FINDINGS: Right lung is grossly clear. Enlarged cardiomediastinal silhouette. Suspected airspace disease at the lingula and left base, not significantly changed. Aortic atherosclerosis. No pneumothorax. IMPRESSION: 1. Suspected airspace disease at the lingula and left base which may reflect atelectasis or pneumonia 2. Mild cardiomegaly Electronically Signed   By: Donavan Foil M.D.   On: 08/20/2018 19:35     IMPRESSION AND  PLAN:    85 y.o. male with a known history of BPH, chronic kidney disease stage III, diabetes, GERD, hypertension, osteoporosis,, previous CVA, previous history of ESBL UTI who presents to the hospital due to weakness, altered mental status.  1.  Altered mental status/lethargy- metabolic encephalopathy secondary to dehydration and pneumonia.   We will hydrate the patient with IV fluids, give IV antibiotics for the pneumonia and follow mental status.  2.  Pneumonia- incidentally noted on chest x-ray on admission.  Clinically patient has no shortness of breath, fever, elevated white cell count. - Patient is not hypoxic.  We will empirically treat the patient with IV ceftriaxone, Zithromax.  Follow cultures.  3.  Recent ESBL UTI- patient had a multidrug-resistant Klebsiella UTI last month.  Patient has been treated with IV meropenem as per the patient's daughter. -Patient's urinalysis is still positive.  Await urine cultures to decide if he needs to be retreated.  Consider ID consult if needed.  4.  Dehydration-secondary to poor p.o. intake.  We will gently hydrate the patient with IV fluids, follow BUN/creatinine urine output.  5.  BPH- patient has a chronic suprapubic catheter.  Patient is followed by Dr. Bernardo Heater.  He recently had a suprapubic catheter change. - Continue Avodart.  6.  Atrial flutter-patient developed atrial flutter here in the ER with variable rates. - Continue Toprol, patient given a bolus of IV Cardizem in the ER.  We will keep the patient on telemetry. -Check echocardiogram, will get a cardiology consult. - will start heparin gtt  7.  Diabetes- we will place the patient on sliding scale insulin.  8.  GERD-continue Protonix.  Discussed plan of care with patient's daughter over the phone.  All the records are reviewed and case discussed with ED provider. Management plans discussed with the patient, family and they are in agreement.  CODE STATUS: DNR  TOTAL TIME  TAKING CARE OF THIS PATIENT: 50 minutes.    Henreitta Leber M.D on 08/20/2018 at 9:45 PM  Between 7am to 6pm - Pager - [phone]  After 6pm go to [email or web address] - password [email or web address] [facility]  Office  [phone]  CC: Primary care physician; Patient, No Pcp Per

## 2018-08-20 NOTE — ED Triage Notes (Signed)
Patient is short of breath, bradycardic, and hypotensive.

## 2018-08-20 NOTE — ED Provider Notes (Signed)
[facility] Emergency Department Provider Note   ____________________________________________    I have reviewed the triage vital signs and the nursing notes.   HISTORY  Chief Complaint Shortness of Breath  Altered mental status   HPI Dustin Hicks is a 85 y.o. male who presents with reports of shortness of breath, hypotension, bradycardia per EMS.  Patient is unable to provide any history.  Apparently at his baseline the patient is able to carry on a conversation and feed himself.  Apparently routine COVID test this morning, has not been exhibiting symptoms reportedly.  No known fevers.   Past Medical History:  Diagnosis Date   BPH (benign prostatic hyperplasia)    Chronic kidney disease    Constipation    Depression    Diabetes (HCC)    Dysphagia    Edema    GERD (gastroesophageal reflux disease)    Hypertension    Hypokalemia    Iron deficiency anemia    Osteoporosis    Pressure ulcer    right heel, stage 3 per   Stroke [facility](HCC)    UTI (urinary tract infection)     Patient Active Problem List   Diagnosis Date Noted   Urinary retention 11/03/2017   Pressure injury of skin 06/09/2017   Complicated UTI (urinary tract infection) 06/08/2017   Chronic kidney disease 11/11/2016   Lymphocytosis 11/11/2016   Type 2 diabetes mellitus, with long-term current use of insulin (HCC) 11/11/2016    Past Surgical History:  Procedure Laterality Date   IR CATHETER TUBE CHANGE  06/11/2018   IR CATHETER TUBE CHANGE  07/09/2018   SUPRAPUBIC CATHETER INSERTION      Prior to Admission medications   Medication Sig Start Date End Date Taking? Authorizing Provider  acetaminophen (TYLENOL) 325 MG tablet Take 650 mg by mouth every 4 (four) hours as needed for mild pain or fever.   Yes [provider]  acetaminophen (TYLENOL) 500 MG tablet Take 1,000 mg by mouth every 8 (eight) hours as needed for moderate pain.    Yes  [provider]  acetic acid 0.25 % irrigation Irrigate with 1 application as directed at bedtime.   Yes [provider]  aspirin 81 MG chewable tablet Chew 81 mg by mouth daily.   Yes [provider]  calcium carbonate (TUMS - DOSED IN MG ELEMENTAL CALCIUM) 500 MG chewable tablet Chew 1 tablet by mouth daily.   Yes [provider]  Cholecalciferol 25 MCG (1000 UT) capsule Take 1,000 Units by mouth daily.   Yes [provider]  citalopram (CELEXA) 20 MG tablet Take 20 mg by mouth daily.   Yes [provider]  dutasteride (AVODART) 0.5 MG capsule Take 0.5 mg by mouth daily.   Yes [provider]  ferrous sulfate 220 (44 Fe) MG/5ML solution Take 330 mg by mouth daily.   Yes [provider]  furosemide (LASIX) 20 MG tablet Take 20 mg by mouth daily.    Yes [provider]  guaiFENesin (ROBITUSSIN) 100 MG/5ML SOLN Take 10 mLs by mouth every 4 (four) hours as needed for cough or to loosen phlegm.   Yes [provider]  insulin glargine (LANTUS) 100 UNIT/ML injection Inject 0.1 mLs (10 Units total) into the skin at bedtime. Patient taking differently: Inject 5 Units into the skin at bedtime.  06/12/17  Yes [place] SirenSainani, Vivek J, MD  metoprolol succinate (TOPROL-XL) 25 MG 24 hr tablet Take 12.5 mg by mouth daily.  Yes [provider]  pantoprazole (PROTONIX) 40 MG tablet Take 40 mg by mouth daily.    Yes [provider]  polyethylene glycol (MIRALAX / GLYCOLAX) packet Take 17 g by mouth daily as needed for mild constipation. 06/12/17  Yes Sainani, Belia Heman, MD  potassium chloride (K-DUR) 10 MEQ tablet Take 10 mEq by mouth daily.   Yes [provider]  QUEtiapine (SEROQUEL) 25 MG tablet Take 12.5 mg by mouth at bedtime.    Yes [provider]  Throat Lozenges (COUGH DROPS MENTHOL) LOZG Use as directed 1 lozenge in the mouth or throat 4 (four) times daily as needed (sore throat).   Yes  [provider]  traMADol (ULTRAM) 50 MG tablet Take 1 tablet (50 mg total) by mouth 3 (three) times daily as needed for moderate pain. 06/12/17  Yes Henreitta Leber, MD  traZODone (DESYREL) 50 MG tablet Take 50 mg by mouth every evening.   Yes [provider]  zinc oxide 20 % ointment Apply 1 application topically as needed for irritation. Xerosis cutis   Yes [provider]     Allergies Patient has no known allergies.  No family history on file.  Social History Social History   Tobacco Use   Smoking status: Never Smoker   Smokeless tobacco: Never Used  Substance Use Topics   Alcohol use: Never    Frequency: Never   Drug use: Never    Review of Systems unable to obtain due to altered mental status     ____________________________________________   PHYSICAL EXAM:  VITAL SIGNS: ED Triage Vitals  Enc Vitals Group     BP 08/20/18 1906 130/74     Pulse Rate 08/20/18 1906 (!) 55     Resp 08/20/18 1906 (!) 28     Temp 08/20/18 1906 98.1 F (36.7 C)     Temp src --      SpO2 08/20/18 1906 99 %     Weight 08/20/18 1916 85 kg (187 lb 6.3 oz)     Height 08/20/18 1916 1.829 m (6')     Head Circumference --      Peak Flow --      Pain Score --      Pain Loc --      Pain Edu? --      Excl. in [place]? --     Constitutional: Unresponsive Eyes: Conjunctivae are normal.  Head: Atraumatic. Nose: No congestion/rhinnorhea. Mouth/Throat: Mucous membranes are dry Neck: Bruising around the neck Cardiovascular: Bradycardia, regular rhythm.  Good peripheral circulation. Respiratory: Normal respiratory effort.  No retractions. Lungs CTAB. Gastrointestinal: Soft  No distention.   GU: Suprapubic tube Musculoskeletal: Extremities contracted warm and well perfused Neurologic: Contracted extremities, currently not responding Skin:  Skin is warm, dry and intact. No rash noted.   ____________________________________________   LABS (all labs ordered  are listed, but only abnormal results are displayed)  Labs Reviewed  COMPREHENSIVE METABOLIC PANEL - Abnormal; Notable for the following components:      Result Value   BUN 32 (*)    Creatinine, Ser 1.75 (*)    Calcium 8.2 (*)    Albumin 3.2 (*)    GFR calc non Af Amer 34 (*)    GFR calc Af Amer 39 (*)    All other components within normal limits  CBC WITH DIFFERENTIAL/PLATELET - Abnormal; Notable for the following components:   WBC 10.6 (*)    RBC 4.08 (*)    Hemoglobin 11.6 (*)  HCT 37.6 (*)    Lymphs Abs 5.5 (*)    All other components within normal limits  URINALYSIS, COMPLETE (UACMP) WITH MICROSCOPIC - Abnormal; Notable for the following components:   Color, Urine YELLOW (*)    APPearance HAZY (*)    Protein, ur 30 (*)    Leukocytes,Ua LARGE (*)    WBC, UA >50 (*)    All other components within normal limits  SARS CORONAVIRUS 2 (HOSPITAL ORDER, PERFORMED IN [facility])  CULTURE, BLOOD (ROUTINE X 2)  CULTURE, BLOOD (ROUTINE X 2)  URINE CULTURE  LACTIC ACID, PLASMA  MAGNESIUM   ____________________________________________  EKG  ED ECG REPORT I, Jene Everyobert Frazier Balfour, the attending physician, personally viewed and interpreted this ECG.  Date: 08/20/2018  Rhythm: Sinus bradycardia QRS Axis: normal Intervals: IVCD ST/T Wave abnormalities: Nonspecific changes Narrative Interpretation: Abnormal EKG  ____________________________________________  RADIOLOGY  Chest x-ray concerning for pneumonia lingula left base ____________________________________________   PROCEDURES  Procedure(s) performed: No  Procedures   Critical Care performed: yes  CRITICAL CARE Performed by: Jene Everyobert Mainor Hellmann   Total critical care time: 30 minutes  Critical care time was exclusive of separately billable procedures and treating other patients.  Critical care was necessary to treat or prevent imminent or life-threatening deterioration.  Critical care was time spent  personally by me on the following activities: development of treatment plan with patient and/or surrogate as well as nursing, discussions with consultants, evaluation of patient's response to treatment, examination of patient, obtaining history from patient or surrogate, ordering and performing treatments and interventions, ordering and review of laboratory studies, ordering and review of radiographic studies, pulse oximetry and re-evaluation of patient's condition.  ____________________________________________   INITIAL IMPRESSION / ASSESSMENT AND PLAN / ED COURSE  Pertinent labs & imaging results that were available during my care of the patient were reviewed by me and considered in my medical decision making (see chart for details).  Patient presents with altered mental status, apparently is typically able to carry on a conversation now is not responsive.  He is mildly bradycardic, blood pressure here is reassuring and pulse oximetry is appropriate on nasal cannula.  Suspicious for sepsis given altered mental status and hypotension will treat with broad-spectrum antibiotics, call code sepsis while we await labs, chest x-ray  Chest x-ray suspicious for airspace disease, patient has suprapubic tube likely colonization, covered with IV antibiotics  Admit to the hospitalist service.  Patient found to be in atrial flutter, given low-dose Cardizem bolus with improvement.  Hospitalist aware    ____________________________________________   FINAL CLINICAL IMPRESSION(S) / ED DIAGNOSES  Final diagnoses:  Acute respiratory failure with hypoxia (HCC)  Community acquired pneumonia of left lower lobe of lung (HCC)  Bradycardia        Note:  This document was prepared using Dragon voice recognition software and may include unintentional dictation errors.   Jene EveryKinner, Jaquel Coomer, MD 08/20/18 2142

## 2018-08-21 ENCOUNTER — Inpatient Hospital Stay (HOSPITAL_COMMUNITY)
Admit: 2018-08-21 | Discharge: 2018-08-21 | Disposition: A | Discharge status: Home / Self Care | Payer: Medicare Other | Attending: Specialist | Admitting: Specialist

## 2018-08-21 DIAGNOSIS — I4892 Unspecified atrial flutter: Secondary | ICD-10-CM

## 2018-08-21 LAB — CBC
HCT: 35.5 % — ABNORMAL LOW (ref 39.0–52.0)
Hemoglobin: 10.6 g/dL — ABNORMAL LOW (ref 13.0–17.0)
MCH: 27.7 pg (ref 26.0–34.0)
MCHC: 29.9 g/dL — ABNORMAL LOW (ref 30.0–36.0)
MCV: 92.9 fL (ref 80.0–100.0)
Platelets: 171 10*3/uL (ref 150–400)
RBC: 3.82 MIL/uL — ABNORMAL LOW (ref 4.22–5.81)
RDW: 14.8 % (ref 11.5–15.5)
WBC: 11.2 10*3/uL — ABNORMAL HIGH (ref 4.0–10.5)
nRBC: 0 % (ref 0.0–0.2)

## 2018-08-21 LAB — BASIC METABOLIC PANEL
Anion gap: 9 (ref 5–15)
BUN: 28 mg/dL — ABNORMAL HIGH (ref 8–23)
CO2: 22 mmol/L (ref 22–32)
Calcium: 7.8 mg/dL — ABNORMAL LOW (ref 8.9–10.3)
Chloride: 112 mmol/L — ABNORMAL HIGH (ref 98–111)
Creatinine, Ser: 1.62 mg/dL — ABNORMAL HIGH (ref 0.61–1.24)
GFR calc Af Amer: 43 mL/min — ABNORMAL LOW (ref >60)
GFR calc non Af Amer: 37 mL/min — ABNORMAL LOW (ref >60)
Glucose, Bld: 83 mg/dL (ref 70–99)
Potassium: 4.1 mmol/L (ref 3.5–5.1)
Sodium: 143 mmol/L (ref 135–145)

## 2018-08-21 LAB — GLUCOSE, CAPILLARY
Glucose-Capillary: 79 mg/dL (ref 70–99)
Glucose-Capillary: 141 mg/dL — ABNORMAL HIGH (ref 70–99)
Glucose-Capillary: 138 mg/dL — ABNORMAL HIGH (ref 70–99)
Glucose-Capillary: 114 mg/dL — ABNORMAL HIGH (ref 70–99)

## 2018-08-21 LAB — PROCALCITONIN: Procalcitonin: <0.1 ng/mL

## 2018-08-21 LAB — HEPARIN LEVEL (UNFRACTIONATED)
Heparin Unfractionated: 0.54 IU/mL (ref 0.30–0.70)
Heparin Unfractionated: 0.61 IU/mL (ref 0.30–0.70)

## 2018-08-21 MED ORDER — ACETAMINOPHEN 325 MG PO TABS: 650.0000 mg | ORAL_TABLET | Freq: Four times a day (QID) | ORAL | Status: DC | PRN

## 2018-08-21 MED ORDER — ASPIRIN 81 MG PO CHEW
81.0000 mg | CHEWABLE_TABLET | Freq: Every day | ORAL | Status: DC
  Administered 2018-08-21 – 2018-08-24 (×4): 81 mg via ORAL
  Filled 2018-08-21 (×4): qty 1

## 2018-08-21 MED ORDER — CITALOPRAM HYDROBROMIDE 20 MG PO TABS
20.0000 mg | ORAL_TABLET | Freq: Every day | ORAL | Status: DC
  Administered 2018-08-21 – 2018-08-24 (×4): 20 mg via ORAL
  Filled 2018-08-21 (×4): qty 1

## 2018-08-21 MED ORDER — POLYETHYLENE GLYCOL 3350 17 G PO PACK: 17.0000 g | PACK | Freq: Every day | ORAL | Status: DC | PRN

## 2018-08-21 MED ORDER — PANTOPRAZOLE SODIUM 40 MG PO TBEC
40.0000 mg | DELAYED_RELEASE_TABLET | Freq: Every day | ORAL | Status: DC
  Administered 2018-08-21 – 2018-08-24 (×4): 40 mg via ORAL
  Filled 2018-08-21 (×4): qty 1

## 2018-08-21 MED ORDER — INSULIN ASPART 100 UNIT/ML ~~LOC~~ SOLN: 0.0000 [IU] | Freq: Every day | SUBCUTANEOUS | Status: DC

## 2018-08-21 MED ORDER — QUETIAPINE FUMARATE 25 MG PO TABS
12.5000 mg | ORAL_TABLET | Freq: Every day | ORAL | Status: DC
  Administered 2018-08-21 – 2018-08-23 (×3): 12.5 mg via ORAL
  Filled 2018-08-21 (×3): qty 1

## 2018-08-21 MED ORDER — METOPROLOL SUCCINATE ER 25 MG PO TB24
12.5000 mg | ORAL_TABLET | Freq: Every day | ORAL | Status: DC
  Administered 2018-08-21 – 2018-08-22 (×2): 12.5 mg via ORAL
  Filled 2018-08-21 (×2): qty 1

## 2018-08-21 MED ORDER — ONDANSETRON HCL 4 MG/2ML IJ SOLN: 4.0000 mg | Freq: Four times a day (QID) | INTRAMUSCULAR | Status: DC | PRN

## 2018-08-21 MED ORDER — VITAMIN D 25 MCG (1000 UNIT) PO TABS
1000.0000 [IU] | ORAL_TABLET | Freq: Every day | ORAL | Status: DC
  Administered 2018-08-21 – 2018-08-24 (×4): 1000 [IU] via ORAL
  Filled 2018-08-21 (×4): qty 1

## 2018-08-21 MED ORDER — POTASSIUM CHLORIDE CRYS ER 10 MEQ PO TBCR: 10.0000 meq | EXTENDED_RELEASE_TABLET | Freq: Every day | ORAL | Status: DC

## 2018-08-21 MED ORDER — DEXTROSE 5 % IV SOLN: 250.0000 mg | INTRAVENOUS | Status: DC

## 2018-08-21 MED ORDER — ONDANSETRON HCL 4 MG PO TABS: 4.0000 mg | ORAL_TABLET | Freq: Four times a day (QID) | ORAL | Status: DC | PRN

## 2018-08-21 MED ORDER — TRAZODONE HCL 50 MG PO TABS
50.0000 mg | ORAL_TABLET | Freq: Every evening | ORAL | Status: DC
  Administered 2018-08-21 – 2018-08-23 (×3): 50 mg via ORAL
  Filled 2018-08-21 (×3): qty 1

## 2018-08-21 MED ORDER — ACETAMINOPHEN 650 MG RE SUPP: 650.0000 mg | Freq: Four times a day (QID) | RECTAL | Status: DC | PRN

## 2018-08-21 MED ORDER — SODIUM CHLORIDE 0.9 % IV SOLN: 1.0000 g | INTRAVENOUS | Status: DC

## 2018-08-21 MED ORDER — AZITHROMYCIN 250 MG PO TABS
250.0000 mg | ORAL_TABLET | Freq: Every day | ORAL | Status: DC
  Administered 2018-08-21 – 2018-08-23 (×3): 250 mg via ORAL
  Filled 2018-08-21 (×3): qty 1

## 2018-08-21 MED ORDER — GUAIFENESIN 100 MG/5ML PO SOLN
10.0000 mL | ORAL | Status: DC | PRN
  Filled 2018-08-21: qty 10

## 2018-08-21 MED ORDER — DUTASTERIDE 0.5 MG PO CAPS
0.5000 mg | ORAL_CAPSULE | Freq: Every day | ORAL | Status: DC
  Administered 2018-08-21 – 2018-08-24 (×4): 0.5 mg via ORAL
  Filled 2018-08-21 (×4): qty 1

## 2018-08-21 MED ORDER — INSULIN ASPART 100 UNIT/ML ~~LOC~~ SOLN
0.0000 [IU] | Freq: Three times a day (TID) | SUBCUTANEOUS | Status: DC
  Administered 2018-08-22: 2 [IU] via SUBCUTANEOUS
  Filled 2018-08-21: qty 1

## 2018-08-21 MED ORDER — TRAMADOL HCL 50 MG PO TABS
50.0000 mg | ORAL_TABLET | Freq: Three times a day (TID) | ORAL | Status: DC | PRN
  Administered 2018-08-21 – 2018-08-22 (×3): 50 mg via ORAL
  Filled 2018-08-21 (×3): qty 1

## 2018-08-21 NOTE — Progress Notes (Signed)
[employer] - [place] at [facility]                                                                                                                                                                                  Patient Demographics   Dustin Hicks, is a 85 y.o. male, DOB - 02/25/1928, WUJ:[id number]  Admit date - 08/20/2018   Admitting Physician Houston SirenVivek J Sainani, MD  Outpatient Primary MD for the patient is Patient, No Pcp Per   LOS - 1  Subjective: Patient admitted with altered mental status He is doing better has baseline confusion   Review of Systems:   CONSTITUTIONAL: Confused at baseline  Vitals:   Vitals:   08/21/18 0000 08/21/18 0030 08/21/18 0235 08/21/18 0801  BP: (!) 149/50 (!) 132/47 126/89 131/66  Pulse: (!) 41 (!) 49 80 (!) 44  Resp: 18 15  18   Temp:    98.3 F (36.8 C)  TempSrc:    Oral  SpO2: 98% 98% 97% 99%  Weight:   78.1 kg   Height:   5\' 9"  (1.753 m)     Wt Readings from Last 3 Encounters:  08/21/18 78.1 kg  07/28/18 80.3 kg  06/11/18 80.7 kg     Intake/Output Summary (Last 24 hours) at 08/21/2018 1225 Last data filed at 08/21/2018 0936 Gross per 24 hour  Intake 2090 ml  Output 400 ml  Net 1690 ml    Physical Exam:   GENERAL: Chronically ill-appearing HEAD, EYES, EARS, NOSE AND THROAT: Atraumatic, normocephalic. Extraocular muscles are intact. Pupils equal and reactive to light. Sclerae anicteric. No conjunctival injection. No oro-pharyngeal erythema.  NECK: Supple. There is no jugular venous distention. No bruits, no lymphadenopathy, no thyromegaly.  HEART: Regular rate and rhythm,. No murmurs, no rubs, no clicks.  LUNGS: Clear to auscultation bilaterally. No rales or rhonchi. No wheezes.  ABDOMEN: Soft, flat, nontender, nondistended. Has good bowel sounds. No hepatosplenomegaly appreciated.  EXTREMITIES: No evidence of any cyanosis, clubbing, or peripheral edema.  +2 pedal and radial pulses bilaterally.  NEUROLOGIC: The patient  is alert, no focal deficits SKIN: Moist and warm with no rashes appreciated.  Psych: Not anxious, depressed LN: No inguinal LN enlargement    Antibiotics   Anti-infectives (From admission, onward)   Start     Dose/Rate Route Frequency Ordered Stop   08/21/18 2100  azithromycin (ZITHROMAX) tablet 250 mg     250 mg Oral Daily 08/21/18 0308 08/25/18 2059   08/21/18 0245  cefTRIAXone (ROCEPHIN) 1 g in sodium chloride 0.9 % 100 mL IVPB  Status:  Discontinued     1 g 200 mL/hr over 30 Minutes Intravenous Every 24 hours  08/21/18 0241 08/21/18 0259   08/21/18 0241  azithromycin (ZITHROMAX) 250 mg in dextrose 5 % 125 mL IVPB  Status:  Discontinued     250 mg 125 mL/hr over 60 Minutes Intravenous Every 24 hours 08/21/18 0241 08/21/18 0308   08/20/18 2015  cefTRIAXone (ROCEPHIN) 2 g in sodium chloride 0.9 % 100 mL IVPB     2 g 200 mL/hr over 30 Minutes Intravenous Every 24 hours 08/20/18 2006     08/20/18 2015  azithromycin (ZITHROMAX) 500 mg in sodium chloride 0.9 % 250 mL IVPB  Status:  Discontinued     500 mg 250 mL/hr over 60 Minutes Intravenous Every 24 hours 08/20/18 2006 08/21/18 0300      Medications   Scheduled Meds: . aspirin  81 mg Oral Daily  . azithromycin  250 mg Oral Daily  . cholecalciferol  1,000 Units Oral Daily  . citalopram  20 mg Oral Daily  . dutasteride  0.5 mg Oral Daily  . insulin aspart  0-5 Units Subcutaneous QHS  . insulin aspart  0-9 Units Subcutaneous TID WC  . metoprolol succinate  12.5 mg Oral Daily  . pantoprazole  40 mg Oral Daily  . QUEtiapine  12.5 mg Oral QHS  . traZODone  50 mg Oral QPM   Continuous Infusions: . sodium chloride 75 mL/hr at 08/21/18 0434  . cefTRIAXone (ROCEPHIN)  IV Stopped (08/20/18 2055)  . heparin 1,200 Units/hr (08/21/18 0014)   PRN Meds:.acetaminophen **OR** acetaminophen, guaiFENesin, ondansetron **OR** ondansetron (ZOFRAN) IV, polyethylene glycol, traMADol   Data Review:   Micro Results Recent Results (from the  past 240 hour(s))  SARS Coronavirus 2 (CEPHEID - Performed in [facility] hospital lab), Hosp Order     Status: None   Collection Time: 08/20/18  7:19 PM   Specimen: Nasopharyngeal Swab  Result Value Ref Range Status   SARS Coronavirus 2 NEGATIVE NEGATIVE Final    Comment: (NOTE) If result is NEGATIVE SARS-CoV-2 target nucleic acids are NOT DETECTED. The SARS-CoV-2 RNA is generally detectable in upper and lower  respiratory specimens during the acute phase of infection. The lowest  concentration of SARS-CoV-2 viral copies this assay can detect is 250  copies / mL. A negative result does not preclude SARS-CoV-2 infection  and should not be used as the sole basis for treatment or other  patient management decisions.  A negative result may occur with  improper specimen collection / handling, submission of specimen other  than nasopharyngeal swab, presence of viral mutation(s) within the  areas targeted by this assay, and inadequate number of viral copies  (<250 copies / mL). A negative result must be combined with clinical  observations, patient history, and epidemiological information. If result is POSITIVE SARS-CoV-2 target nucleic acids are DETECTED. The SARS-CoV-2 RNA is generally detectable in upper and lower  respiratory specimens dur ing the acute phase of infection.  Positive  results are indicative of active infection with SARS-CoV-2.  Clinical  correlation with patient history and other diagnostic information is  necessary to determine patient infection status.  Positive results do  not rule out bacterial infection or co-infection with other viruses. If result is PRESUMPTIVE POSTIVE SARS-CoV-2 nucleic acids MAY BE PRESENT.   A presumptive positive result was obtained on the submitted specimen  and confirmed on repeat testing.  While 2019 novel coronavirus  (SARS-CoV-2) nucleic acids may be present in the submitted sample  additional confirmatory testing may be necessary for  epidemiological  and / or clinical management  purposes  to differentiate between  SARS-CoV-2 and other Sarbecovirus currently known to infect humans.  If clinically indicated additional testing with an alternate test  methodology [phone]([id number]) is advised. The SARS-CoV-2 RNA is generally  detectable in upper and lower respiratory sp ecimens during the acute  phase of infection. The expected result is Negative. Fact Sheet for Patients:  BoilerBrush.com.cyhttps://www.fda.gov/media/136312/download Fact Sheet for Healthcare Providers: https://pope.com/https://www.fda.gov/media/136313/download This test is not yet approved or cleared by the [place] FDA and has been authorized for detection and/or diagnosis of SARS-CoV-2 by FDA under an Emergency Use Authorization (EUA).  This EUA will remain in effect (meaning this test can be used) for the duration of the COVID-19 declaration under Section 564(b)(1) of the Act, 21 U.S.C. section 360bbb-3(b)(1), unless the authorization is terminated or revoked sooner. Performed at [facility], [address] [place], [place] [address]   Blood Culture (routine x 2)     Status: None (Preliminary result)   Collection Time: 08/20/18  7:20 PM   Specimen: BLOOD  Result Value Ref Range Status   Specimen Description BLOOD RIGHT ARM  Final   Special Requests   Final    BOTTLES DRAWN AEROBIC AND ANAEROBIC Blood Culture results may not be optimal due to an excessive volume of blood received in culture bottles   Culture   Final    NO GROWTH < 12 HOURS Performed at [facility], [address] [place], [place] [address]    Report Status PENDING  Incomplete  Blood Culture (routine x 2)     Status: None (Preliminary result)   Collection Time: 08/20/18  7:20 PM   Specimen: BLOOD  Result Value Ref Range Status   Specimen Description BLOOD LEFT WRIST  Final   Special Requests   Final    BOTTLES DRAWN AEROBIC AND ANAEROBIC Blood Culture adequate volume   Culture   Final     NO GROWTH < 12 HOURS Performed at [facility], [address] [place], [place] [address]    Report Status PENDING  Incomplete    Radiology Reports Dg Chest 1 View  Result Date: 07/28/2018 CLINICAL DATA:  Unwitnessed fall. EXAM: CHEST  1 VIEW COMPARISON:  Radiograph of June 08, 2017. FINDINGS: Stable cardiomegaly. Atherosclerosis of thoracic aorta is noted. No pneumothorax is noted. Right lung is clear. Possible mild left basilar atelectasis is noted with associated effusion. Bony thorax is unremarkable. IMPRESSION: Possible mild left basilar atelectasis is noted with small pleural effusion. Aortic Atherosclerosis (ICD10-I70.0). Electronically Signed   By: Lupita RaiderJames  Green Jr M.D.   On: 07/28/2018 16:19   Dg Pelvis 1-2 Views  Result Date: 07/28/2018 CLINICAL DATA:  Unwitnessed fall. EXAM: PELVIS - 1-2 VIEW COMPARISON:  None. FINDINGS: There is no evidence of pelvic fracture or diastasis. No pelvic bone lesions are seen. Suprapubic catheter is noted. IMPRESSION: No acute abnormality seen in the pelvis. Electronically Signed   By: Lupita RaiderJames  Green Jr M.D.   On: 07/28/2018 16:20   Ct Head Wo Contrast  Result Date: 07/28/2018 CLINICAL DATA:  Fall EXAM: CT HEAD WITHOUT CONTRAST CT MAXILLOFACIAL WITHOUT CONTRAST CT CERVICAL SPINE WITHOUT CONTRAST TECHNIQUE: Multidetector CT imaging of the head, cervical spine, and maxillofacial structures were performed using the standard protocol without intravenous contrast. Multiplanar CT image reconstructions of the cervical spine and maxillofacial structures were also generated. COMPARISON:  None. FINDINGS: CT HEAD FINDINGS Brain: There is no mass, hemorrhage or extra-axial collection. There is generalized atrophy without lobar predilection. There is hypoattenuation of  the periventricular white matter, most commonly indicating chronic ischemic microangiopathy. Vascular: No abnormal hyperdensity of the major intracranial arteries or dural venous sinuses. No  intracranial atherosclerosis. Skull: Small left frontal scalp laceration.  No skull fracture. CT MAXILLOFACIAL FINDINGS Osseous: --Complex facial fracture types: No LeFort, zygomaticomaxillary complex or nasoorbitoethmoidal fracture. --Simple fracture types: None. --Mandible: No fracture or dislocation. Orbits: The globes are intact. Normal appearance of the intra- and extraconal fat. Symmetric extraocular muscles and optic nerves. Sinuses: No fluid levels or advanced mucosal thickening. Soft tissues: Small left periorbital soft tissue hematoma. CT CERVICAL SPINE FINDINGS Alignment: No static subluxation. Facets are aligned. Occipital condyles and the lateral masses of C1-C2 are aligned. Skull base and vertebrae: No acute fracture. Soft tissues and spinal canal: No prevertebral fluid or swelling. No visible canal hematoma. Disc levels: Multilevel facet hypertrophy greatest at C3-5. No bony spinal canal stenosis. Upper chest: No pneumothorax, pulmonary nodule or pleural effusion. Other: Normal visualized paraspinal cervical soft tissues. IMPRESSION: 1. No acute intracranial abnormality. 2. Small left frontal scalp laceration and left periorbital soft tissue hematoma. No facial fracture. 3. No acute fracture or static subluxation of the cervical spine. Electronically Signed   By: Deatra Robinson M.D.   On: 07/28/2018 16:14   Ct Cervical Spine Wo Contrast  Result Date: 07/28/2018 CLINICAL DATA:  Fall EXAM: CT HEAD WITHOUT CONTRAST CT MAXILLOFACIAL WITHOUT CONTRAST CT CERVICAL SPINE WITHOUT CONTRAST TECHNIQUE: Multidetector CT imaging of the head, cervical spine, and maxillofacial structures were performed using the standard protocol without intravenous contrast. Multiplanar CT image reconstructions of the cervical spine and maxillofacial structures were also generated. COMPARISON:  None. FINDINGS: CT HEAD FINDINGS Brain: There is no mass, hemorrhage or extra-axial collection. There is generalized atrophy without  lobar predilection. There is hypoattenuation of the periventricular white matter, most commonly indicating chronic ischemic microangiopathy. Vascular: No abnormal hyperdensity of the major intracranial arteries or dural venous sinuses. No intracranial atherosclerosis. Skull: Small left frontal scalp laceration.  No skull fracture. CT MAXILLOFACIAL FINDINGS Osseous: --Complex facial fracture types: No LeFort, zygomaticomaxillary complex or nasoorbitoethmoidal fracture. --Simple fracture types: None. --Mandible: No fracture or dislocation. Orbits: The globes are intact. Normal appearance of the intra- and extraconal fat. Symmetric extraocular muscles and optic nerves. Sinuses: No fluid levels or advanced mucosal thickening. Soft tissues: Small left periorbital soft tissue hematoma. CT CERVICAL SPINE FINDINGS Alignment: No static subluxation. Facets are aligned. Occipital condyles and the lateral masses of C1-C2 are aligned. Skull base and vertebrae: No acute fracture. Soft tissues and spinal canal: No prevertebral fluid or swelling. No visible canal hematoma. Disc levels: Multilevel facet hypertrophy greatest at C3-5. No bony spinal canal stenosis. Upper chest: No pneumothorax, pulmonary nodule or pleural effusion. Other: Normal visualized paraspinal cervical soft tissues. IMPRESSION: 1. No acute intracranial abnormality. 2. Small left frontal scalp laceration and left periorbital soft tissue hematoma. No facial fracture. 3. No acute fracture or static subluxation of the cervical spine. Electronically Signed   By: Deatra Robinson M.D.   On: 07/28/2018 16:14   Dg Chest Port 1 View  Result Date: 08/20/2018 CLINICAL DATA:  Altered mental status EXAM: PORTABLE CHEST 1 VIEW COMPARISON:  07/28/2018, 06/08/2017 FINDINGS: Right lung is grossly clear. Enlarged cardiomediastinal silhouette. Suspected airspace disease at the lingula and left base, not significantly changed. Aortic atherosclerosis. No pneumothorax. IMPRESSION:  1. Suspected airspace disease at the lingula and left base which may reflect atelectasis or pneumonia 2. Mild cardiomegaly Electronically Signed   By: Adrian Prows.D.  On: 08/20/2018 19:35   Ct Maxillofacial Wo Contrast  Result Date: 07/28/2018 CLINICAL DATA:  Fall EXAM: CT HEAD WITHOUT CONTRAST CT MAXILLOFACIAL WITHOUT CONTRAST CT CERVICAL SPINE WITHOUT CONTRAST TECHNIQUE: Multidetector CT imaging of the head, cervical spine, and maxillofacial structures were performed using the standard protocol without intravenous contrast. Multiplanar CT image reconstructions of the cervical spine and maxillofacial structures were also generated. COMPARISON:  None. FINDINGS: CT HEAD FINDINGS Brain: There is no mass, hemorrhage or extra-axial collection. There is generalized atrophy without lobar predilection. There is hypoattenuation of the periventricular white matter, most commonly indicating chronic ischemic microangiopathy. Vascular: No abnormal hyperdensity of the major intracranial arteries or dural venous sinuses. No intracranial atherosclerosis. Skull: Small left frontal scalp laceration.  No skull fracture. CT MAXILLOFACIAL FINDINGS Osseous: --Complex facial fracture types: No LeFort, zygomaticomaxillary complex or nasoorbitoethmoidal fracture. --Simple fracture types: None. --Mandible: No fracture or dislocation. Orbits: The globes are intact. Normal appearance of the intra- and extraconal fat. Symmetric extraocular muscles and optic nerves. Sinuses: No fluid levels or advanced mucosal thickening. Soft tissues: Small left periorbital soft tissue hematoma. CT CERVICAL SPINE FINDINGS Alignment: No static subluxation. Facets are aligned. Occipital condyles and the lateral masses of C1-C2 are aligned. Skull base and vertebrae: No acute fracture. Soft tissues and spinal canal: No prevertebral fluid or swelling. No visible canal hematoma. Disc levels: Multilevel facet hypertrophy greatest at C3-5. No bony spinal  canal stenosis. Upper chest: No pneumothorax, pulmonary nodule or pleural effusion. Other: Normal visualized paraspinal cervical soft tissues. IMPRESSION: 1. No acute intracranial abnormality. 2. Small left frontal scalp laceration and left periorbital soft tissue hematoma. No facial fracture. 3. No acute fracture or static subluxation of the cervical spine. Electronically Signed   By: Deatra RobinsonKevin  Herman M.D.   On: 07/28/2018 16:14     CBC Recent Labs  Lab 08/20/18 1919 08/21/18 0552  WBC 10.6* 11.2*  HGB 11.6* 10.6*  HCT 37.6* 35.5*  PLT 197 171  MCV 92.2 92.9  MCH 28.4 27.7  MCHC 30.9 29.9*  RDW 14.8 14.8  LYMPHSABS 5.5*  --   MONOABS 0.8  --   EOSABS 0.3  --   BASOSABS 0.1  --     Chemistries  Recent Labs  Lab 08/20/18 1919 08/21/18 0552  NA 141 143  K 4.1 4.1  CL 108 112*  CO2 22 22  GLUCOSE 93 83  BUN 32* 28*  CREATININE 1.75* 1.62*  CALCIUM 8.2* 7.8*  MG 1.9  --   AST 21  --   ALT 12  --   ALKPHOS 60  --   BILITOT 0.4  --    ------------------------------------------------------------------------------------------------------------------ estimated creatinine clearance is 31.5 mL/min (A) (by C-G formula based on SCr of 1.62 mg/dL (H)). ------------------------------------------------------------------------------------------------------------------ No results for input(s): HGBA1C in the last 72 hours. ------------------------------------------------------------------------------------------------------------------ No results for input(s): CHOL, HDL, LDLCALC, TRIG, CHOLHDL, LDLDIRECT in the last 72 hours. ------------------------------------------------------------------------------------------------------------------ No results for input(s): TSH, T4TOTAL, T3FREE, THYROIDAB in the last 72 hours.  Invalid input(s): FREET3 ------------------------------------------------------------------------------------------------------------------ No results for input(s):  VITAMINB12, FOLATE, FERRITIN, TIBC, IRON, RETICCTPCT in the last 72 hours.  Coagulation profile Recent Labs  Lab 08/20/18 1919  INR 1.0    No results for input(s): DDIMER in the last 72 hours.  Cardiac Enzymes No results for input(s): CKMB, TROPONINI, MYOGLOBIN in the last 168 hours.  Invalid input(s): CK ------------------------------------------------------------------------------------------------------------------ Invalid input(s): POCBNP    Assessment & Plan   85 y.o. male with a known history of BPH, chronic kidney disease  stage III, diabetes, GERD, hypertension, osteoporosis,, previous CVA, previous history of ESBL UTI who presents to the hospital due to weakness, altered mental status.  1.  Altered mental status/lethargy- metabolic encephalopathy secondary to dehydration and pneumonia.   Improved continue IV fluids and antibiotics s.  2.  Pneumonia- incidentally noted on chest x-ray on admission.   Procalcitonin level is low  3.  Recent ESBL UTI- patient had a multidrug-resistant Klebsiella UTI last month.  Patient has been treated with IV meropenem as per the patient's daughter. -Patient's urinalysis is still positive.   Continue ceftriaxone azithromycin for now  4.  Dehydration-secondary to poor p.o. intake.  We will gently hydrate the patient with IV fluids, follow BUN/creatinine urine output.  5.  BPH- patient has a chronic suprapubic catheter.  Patient is followed by Dr. Bernardo Heater.  He recently had a suprapubic catheter change. - Continue Avodart.  6.  Atrial flutter-patient developed atrial flutter here in the ER with variable rates. - Continue Toprol, patient given a bolus of IV Cardizem in the ER.  We will keep the patient on telemetry. -Check echocardiogram, will get a cardiology consult. - continue  heparin gtt  7.  Diabetes- we will place the patient on sliding scale insulin.  8.  GERD-continue Protonix.     Code Status Orders  (From  admission, onward)         Start     Ordered   08/21/18 0242  Do not attempt resuscitation (DNR)  Continuous    Question Answer Comment  In the event of cardiac or respiratory ARREST Do not call a "code blue"   In the event of cardiac or respiratory ARREST Do not perform Intubation, CPR, defibrillation or ACLS   In the event of cardiac or respiratory ARREST Use medication by any route, position, wound care, and other measures to relive pain and suffering. May use oxygen, suction and manual treatment of airway obstruction as needed for comfort.   Comments Nurse to pronounce      08/21/18 0241        Code Status History    Date Active Date Inactive Code Status Order ID Comments User Context   06/08/2017 2119 06/12/2017 2144 DNR [id number]  SalaryAvel Peace, MD Inpatient   Advance Care Planning Activity    Advance Directive Documentation     Most Recent Value  Type of Advance Directive  Out of facility DNR (pink MOST or yellow form)  Pre-existing out of facility DNR order (yellow form or pink MOST form)  Physician notified to receive inpatient order  "MOST" Form in Place?  -           Consults none   DVT Prophylaxis Heparin  Lab Results  Component Value Date   PLT 171 08/21/2018     Time Spent in minutes 51min Greater than 50% of time spent in care coordination and counseling patient regarding the condition and plan of care.   Dustin Flock M.D on 08/21/2018 at 12:25 PM  Between 7am to 6pm - Pager - [phone]  After 6pm go to [email or web address] - Proofreader  [employer]   Office  [phone]

## 2018-08-21 NOTE — ED Notes (Signed)
Per charge hold pt due to decompensating O2 and no ICU beds available  Apple sauce given daughter at bedside feeding pt

## 2018-08-21 NOTE — Progress Notes (Signed)
ANTICOAGULATION CONSULT NOTE - Initial Consult  Pharmacy Consult for heparin Indication: atrial fibrillation  No Known Allergies  Patient Measurements: Height: 5\' 9"  (175.3 cm) Weight: 172 lb 1.6 oz (78.1 kg) IBW/kg (Calculated) : 70.7 Heparin Dosing Weight: 85 kg  Vital Signs: Temp: 98.1 F (36.7 C) (07/03 1906) BP: 126/89 (07/04 0235) Pulse Rate: 80 (07/04 0235)  Labs: Recent Labs    08/20/18 1919 08/21/18 0552  HGB 11.6* 10.6*  HCT 37.6* 35.5*  PLT 197 171  APTT 35  --   LABPROT 13.5  --   INR 1.0  --   HEPARINUNFRC  --  0.54  CREATININE 1.75* 1.62*    Estimated Creatinine Clearance: 31.5 mL/min (A) (by C-G formula based on SCr of 1.62 mg/dL (H)).   Medical History: Past Medical History:  Diagnosis Date  . BPH (benign prostatic hyperplasia)   . Chronic kidney disease   . Constipation   . Depression   . Diabetes ([place])   . Dysphagia   . Edema   . GERD (gastroesophageal reflux disease)   . Hypertension   . Hypokalemia   . Iron deficiency anemia   . Osteoporosis   . Pressure ulcer    right heel, stage 3 per  . Stroke ([place])   . UTI (urinary tract infection)     Medications:  Scheduled:  . aspirin  81 mg Oral Daily  . azithromycin  250 mg Oral Daily  . cholecalciferol  1,000 Units Oral Daily  . citalopram  20 mg Oral Daily  . dutasteride  0.5 mg Oral Daily  . insulin aspart  0-5 Units Subcutaneous QHS  . insulin aspart  0-9 Units Subcutaneous TID WC  . metoprolol succinate  12.5 mg Oral Daily  . pantoprazole  40 mg Oral Daily  . QUEtiapine  12.5 mg Oral QHS  . traZODone  50 mg Oral QPM    Assessment: Patient admitted w/ SOB found to have new onset afib/aflutte on EKG w/ T-wave abnormalities and IVCD. Patient is not on anticoagulation PTA. Baseline labs WNL. Patient is being started on heparin drip for anticoagulation of afib.  Goal of Therapy:  Heparin level 0.3-0.7 units/ml Monitor platelets by anticoagulation protocol: Yes   Plan:   07/04 @ 0600 HL 0.54 therapeutic. Will continue current rate and will recheck HL @ 1400, hgb trended down by one unit, stable, will continue to monitor.  Tobie Lords, PharmD, BCPS Clinical Pharmacist 08/21/2018,7:05 AM

## 2018-08-21 NOTE — ED Notes (Signed)
Report call att

## 2018-08-21 NOTE — Progress Notes (Signed)
Per daughter, pt was on ertapenum for recent infection.

## 2018-08-21 NOTE — ED Notes (Signed)
Per Domingo Pulse, RN sup, floor awaiting pt

## 2018-08-21 NOTE — Evaluation (Addendum)
Clinical/Bedside Swallow Evaluation Patient Details  Name: Dustin Hicks MRN: [id number] Date of Birth: 11-10-1928  Today's Date: 08/21/2018 Time: SLP Start Time (ACUTE ONLY): 1015 SLP Stop Time (ACUTE ONLY): 1115 SLP Time Calculation (min) (ACUTE ONLY): 60 min  Past Medical History:  Past Medical History:  Diagnosis Date  . BPH (benign prostatic hyperplasia)   . Chronic kidney disease   . Constipation   . Depression   . Diabetes ([place])   . Dysphagia   . Edema   . GERD (gastroesophageal reflux disease)   . Hypertension   . Hypokalemia   . Iron deficiency anemia   . Osteoporosis   . Pressure ulcer    right heel, stage 3 per  . Stroke ([place])   . UTI (urinary tract infection)    Past Surgical History:  Past Surgical History:  Procedure Laterality Date  . IR CATHETER TUBE CHANGE  06/11/2018  . IR CATHETER TUBE CHANGE  07/09/2018  . SUPRAPUBIC CATHETER INSERTION     HPI:  Pt is a 85 y.o. male with a known history of BPH, GERD, UTI, chronic kidney disease stage III, diabetes, hypertension, osteoporosis,, previous CVA, previous history of ESBL UTI who presents to the hospital due to weakness, altered mental status.  Patient is a very poor historian therefore most of the history obtained from the ER physician, chart and also speaking to the daughter.  Patient apparently was in his usual state of health and today was found to be less responsive lethargic and therefore sent to the ER for further evaluation.  When patient presented to the ER he was noted to be bradycardic, and slightly hypotensive which resolved with some IV fluids.  Per report by family, pt is on "THICKENED" liquids at [facility]; dysphagia noted per chart H&P. Also, unsure of pt's baseline Cognitive functioning. He does have a h/o stroke. Current CXR: Suspected airspace disease at the lingula and left base which may reflect atelectasis or pneumonia.    Assessment / Plan / Recommendation Clinical Impression  Pt appears to present w/  oropharyngeal phase dysphagia and is at increased risk for dysphagia, aspiration. Per family and chart report, pt has a h/o dysphagia(s/p old stroke?) and has been on "thickened" liquids at the [facility]. W/ presentation of modiified food/liquid trials, pt appears to adequately tolerate such w/ no overt s/s of aspiration noted during/post trials. Pt consumed trials of ice chips, Nectar consistency liquids, and purees/minced solids w/ no overt coughing or decline in vocal quality or respiratory status during/post trials. Pt exhibited oral phase deficits c/b mashing/gumming on the solids trials unbrokened w/ reduced mastication effort secondary to missing Dentition. W/ the more minced solids, pt exhibited timely oral phase management, A-P transfer, and oral clearing following. OM exam appeared grossly wfl during bolus management and clearing. Pt did not follow through w/ formal lingual/labial OMEs. Unsure of his baseline Cognitive status(?). Pt also did not readily feed himself but when given Cues, he held Cup to drink liquids.  Recommend a Dysphagia level 2 diet w/ Nectar consistency liquids; aspiration precautions; Pills in puree for safer swallowing. Supervision at meals w/ tray setup and assistance as needed. Pt may be at his baseline status w/ regard to oropharyngeal phase swallowing - any further f/u can be done at SNF for further assessment of toleration of diet and education w/ staff/pt/family. NSG updated.  SLP Visit Diagnosis: Dysphagia, oropharyngeal phase (R13.12)(Cognitive impact?)    Aspiration Risk  Mild aspiration risk;Risk for inadequate nutrition/hydration    Diet Recommendation  Dysphagia level 2 (MINCED foods) w/ NECTAR consistency liquids; aspiration precautions; Supervision and assistance at meals. Reflux precautions d/t GERD.  Medication Administration: Whole meds with puree(Crushed if necessary)    Other  Recommendations Recommended Consults: (Dietician f/u) Oral Care Recommendations: Oral  care BID;Staff/trained caregiver to provide oral care Other Recommendations: Order thickener from pharmacy;Prohibited food (jello, ice cream, thin soups);Remove water pitcher;Have oral suction available   Follow up Recommendations Skilled Nursing facility      Frequency and Duration min 2x/week  1 week       Prognosis Prognosis for Safe Diet Advancement: Fair Barriers to Reach Goals: Cognitive deficits;Time post onset;Severity of deficits;Behavior      Swallow Study   General Date of Onset: 08/20/18 HPI: Pt is a 85 y.o. male with a known history of BPH, GERD, UTI, chronic kidney disease stage III, diabetes, hypertension, osteoporosis,, previous CVA, previous history of ESBL UTI who presents to the hospital due to weakness, altered mental status.  Patient is a very poor historian therefore most of the history obtained from the ER physician, chart and also speaking to the daughter.  Patient apparently was in his usual state of health and today was found to be less responsive lethargic and therefore sent to the ER for further evaluation.  When patient presented to the ER he was noted to be bradycardic, and slightly hypotensive which resolved with some IV fluids.  Per report by family, pt [facility]; dysphagia noted per chart H&P. Also, unsure of pt's baseline Cognitive functioning. He does have a h/o stroke. Current CXR: Suspected airspace disease at the lingula and left base which may reflect atelectasis or pneumonia.  Type of Study: Bedside Swallow Evaluation Previous Swallow Assessment: unsure Diet Prior to this Study: Regular;Thin liquids Temperature Spikes Noted: No(wbc 11.2) Respiratory Status: Nasal cannula(2 liters) History of Recent Intubation: No Behavior/Cognition: Alert;Cooperative;Pleasant mood;Confused;Distractible;Requires cueing(unsure of his baseline status) Oral Cavity Assessment: Within Functional Limits Oral Care Completed by SLP: Yes Oral Cavity -  Dentition: Poor condition;Missing dentition(most) Vision: Functional for self-feeding Self-Feeding Abilities: Able to feed self;Needs assist;Needs set up;Total assist(follow through in holding cup to drink) Patient Positioning: Upright in bed(needed positioning; pt requested to lie back ) Baseline Vocal Quality: Normal Volitional Cough: Strong Volitional Swallow: Able to elicit    Oral/Motor/Sensory Function Overall Oral Motor/Sensory Function: Within functional limits(grossly )   Ice Chips Ice chips: Within functional limits Presentation: Spoon(fed; 3 trials )   Thin Liquid Thin Liquid: Not tested    Nectar Thick Nectar Thick Liquid: Within functional limits Presentation: Cup;Self Fed;Straw(~4 ozs total)   Honey Thick Honey Thick Liquid: Not tested   Puree Puree: Within functional limits Presentation: Spoon(fed; 3 ozs)   Solid     Solid: Impaired Presentation: Spoon(fed; 6 trials) Oral Phase Impairments: Reduced lingual movement/coordination;Impaired mastication(gumming/mashing) Oral Phase Functional Implications: Impaired mastication(missing Dentition) Pharyngeal Phase Impairments: (none)       Jerilynn SomKatherine Watson, MS, CCC-SLP Watson,Katherine 08/21/2018,1:23 PM

## 2018-08-21 NOTE — Progress Notes (Signed)
*  PRELIMINARY RESULTS* Echocardiogram 2D Echocardiogram has been performed.  Dustin Hicks 08/21/2018, 2:56 PM

## 2018-08-21 NOTE — Progress Notes (Signed)
ANTICOAGULATION CONSULT NOTE  Pharmacy Consult for heparin Indication: atrial fibrillation  Patient Measurements: Height: 5\' 9"  (175.3 cm) Weight: 172 lb 1.6 oz (78.1 kg) IBW/kg (Calculated) : 70.7 Heparin Dosing Weight: 85 kg  Vital Signs: Temp: 98.3 F (36.8 C) (07/04 0801) Temp Source: Oral (07/04 0801) BP: 131/66 (07/04 0801) Pulse Rate: 44 (07/04 0801)  Labs: Recent Labs    08/20/18 1919 08/21/18 0552 08/21/18 1512  HGB 11.6* 10.6*  --   HCT 37.6* 35.5*  --   PLT 197 171  --   APTT 35  --   --   LABPROT 13.5  --   --   INR 1.0  --   --   HEPARINUNFRC  --  0.54 0.61  CREATININE 1.75* 1.62*  --     Estimated Creatinine Clearance: 31.5 mL/min (A) (by C-G formula based on SCr of 1.62 mg/dL (H)).   Medical History: Past Medical History:  Diagnosis Date  . BPH (benign prostatic hyperplasia)   . Chronic kidney disease   . Constipation   . Depression   . Diabetes ([place])   . Dysphagia   . Edema   . GERD (gastroesophageal reflux disease)   . Hypertension   . Hypokalemia   . Iron deficiency anemia   . Osteoporosis   . Pressure ulcer    right heel, stage 3 per  . Stroke ([place])   . UTI (urinary tract infection)     Medications:  Scheduled:  . aspirin  81 mg Oral Daily  . azithromycin  250 mg Oral Daily  . cholecalciferol  1,000 Units Oral Daily  . citalopram  20 mg Oral Daily  . dutasteride  0.5 mg Oral Daily  . insulin aspart  0-5 Units Subcutaneous QHS  . insulin aspart  0-9 Units Subcutaneous TID WC  . metoprolol succinate  12.5 mg Oral Daily  . pantoprazole  40 mg Oral Daily  . QUEtiapine  12.5 mg Oral QHS  . traZODone  50 mg Oral QPM    Assessment: Patient admitted w/ SOB found to have new onset afib/aflutte on EKG w/ T-wave abnormalities and IVCD. Patient is not on anticoagulation PTA. Baseline labs WNL. Patient is being started on heparin drip for anticoagulation of afib.  Goal of Therapy:  Heparin level 0.3-0.7 units/ml Monitor platelets by  anticoagulation protocol: Yes   Heparin Course: 7/4 initiation: bolus 4000 units, then 1200 units/hr  7/4 0552: HL 0.54: no change 7/4 1512: HL 0.61: no change   Plan:   This is the 2nd consecutive therapeutic HL  Continue heparin infusion at 1200 units/hr  Recheck HL with am labs  CBC in am  Vallery Sa, PharmD Clinical Pharmacist 08/21/2018,3:31 PM

## 2018-08-21 NOTE — Plan of Care (Signed)

## 2018-08-21 NOTE — Progress Notes (Addendum)
ANTICOAGULATION CONSULT NOTE - Initial Consult  Pharmacy Consult for heparin Indication: atrial fibrillation  No Known Allergies  Patient Measurements: Height: 6' (182.9 cm) Weight: 187 lb 6.3 oz (85 kg) IBW/kg (Calculated) : 77.6 Heparin Dosing Weight: 85 kg  Vital Signs: Temp: 98.1 F (36.7 C) (07/03 1906) BP: 132/47 (07/04 0030) Pulse Rate: 49 (07/04 0030)  Labs: Recent Labs    08/20/18 1919  HGB 11.6*  HCT 37.6*  PLT 197  APTT 35  LABPROT 13.5  INR 1.0  CREATININE 1.75*    Estimated Creatinine Clearance: 32 mL/min (A) (by C-G formula based on SCr of 1.75 mg/dL (H)).   Medical History: Past Medical History:  Diagnosis Date  . BPH (benign prostatic hyperplasia)   . Chronic kidney disease   . Constipation   . Depression   . Diabetes ([place])   . Dysphagia   . Edema   . GERD (gastroesophageal reflux disease)   . Hypertension   . Hypokalemia   . Iron deficiency anemia   . Osteoporosis   . Pressure ulcer    right heel, stage 3 per  . Stroke ([place])   . UTI (urinary tract infection)     Medications:  Scheduled:    Assessment: Patient admitted w/ SOB found to have new onset afib/aflutte on EKG w/ T-wave abnormalities and IVCD. Patient is not on anticoagulation PTA. Baseline labs WNL. Patient is being started on heparin drip for anticoagulation of afib.  Goal of Therapy:  Heparin level 0.3-0.7 units/ml Monitor platelets by anticoagulation protocol: Yes   Plan:  Will bolus w/ heparin 4000 units IV x 1 Will start rate at 1200 units/hr  Will check HL @ 0600. Will monitor daily CBC's and will adjust rate per anti-Xa levels.  Tobie Lords, PharmD, BCPS Clinical Pharmacist 08/21/2018,1:31 AM

## 2018-08-22 DIAGNOSIS — R001 Bradycardia, unspecified: Secondary | ICD-10-CM

## 2018-08-22 LAB — URINE CULTURE: Culture: 50000 — AB

## 2018-08-22 LAB — CBC
HCT: 34.8 % — ABNORMAL LOW (ref 39.0–52.0)
Hemoglobin: 10.4 g/dL — ABNORMAL LOW (ref 13.0–17.0)
MCH: 28.1 pg (ref 26.0–34.0)
MCHC: 29.9 g/dL — ABNORMAL LOW (ref 30.0–36.0)
MCV: 94.1 fL (ref 80.0–100.0)
Platelets: 165 10*3/uL (ref 150–400)
RBC: 3.7 MIL/uL — ABNORMAL LOW (ref 4.22–5.81)
RDW: 14.8 % (ref 11.5–15.5)
WBC: 10.2 10*3/uL (ref 4.0–10.5)
nRBC: 0 % (ref 0.0–0.2)

## 2018-08-22 LAB — GLUCOSE, CAPILLARY
Glucose-Capillary: 73 mg/dL (ref 70–99)
Glucose-Capillary: 103 mg/dL — ABNORMAL HIGH (ref 70–99)
Glucose-Capillary: 163 mg/dL — ABNORMAL HIGH (ref 70–99)
Glucose-Capillary: 110 mg/dL — ABNORMAL HIGH (ref 70–99)

## 2018-08-22 LAB — ECHOCARDIOGRAM COMPLETE
Height: 69 in
Weight: 2753.6 oz

## 2018-08-22 LAB — MRSA PCR SCREENING: MRSA by PCR: NEGATIVE

## 2018-08-22 LAB — HEPARIN LEVEL (UNFRACTIONATED): Heparin Unfractionated: 0.58 [IU]/mL (ref 0.30–0.70)

## 2018-08-22 NOTE — Progress Notes (Signed)
[employer] - [place] [facility]                                                                                                                                                                                  Patient Demographics   Martavis Gurney, is a 85 y.o. male, DOB - 1928/03/01, VWU:[id number]  Admit date - 08/20/2018   Admitting Physician Houston Siren, MD  Outpatient Primary MD for the patient is Patient, No Pcp Per   LOS - 2  Subjective: Patient admitted with altered mental status He is doing better has baseline confusion   Review of Systems:   CONSTITUTIONAL: Confused at baseline  Vitals:   Vitals:   08/21/18 1624 08/21/18 2009 08/22/18 0356 08/22/18 0725  BP: (!) 159/57 (!) 108/54 132/64 (!) 115/52  Pulse: (!) 40 98 (!) 42 91  Resp: Temp: 97.9 F (36.6 C) 98.1 F (36.7 C) 98 F (36.7 C) 97.9 F (36.6 C)  TempSrc: Oral Oral Oral Oral  SpO2: 97% 100% 100% 99%  Weight:      Height:        Wt Readings from Last 3 Encounters:  08/21/18 78.1 kg  07/28/18 80.3 kg  06/11/18 80.7 kg     Intake/Output Summary (Last 24 hours) at 08/22/2018 1248 Last data filed at 08/22/2018 0949 Gross per 24 hour  Intake 2539.8 ml  Output 2100 ml  Net 439.8 ml    Physical Exam:   GENERAL: Chronically ill-appearing HEAD, EYES, EARS, NOSE AND THROAT: Atraumatic, normocephalic. Extraocular muscles are intact. Pupils equal and reactive to light. Sclerae anicteric. No conjunctival injection. No oro-pharyngeal erythema.  NECK: Supple. There is no jugular venous distention. No bruits, no lymphadenopathy, no thyromegaly.  HEART: Regular rate and rhythm,. No murmurs, no rubs, no clicks.  LUNGS: Clear to auscultation bilaterally. No rales or rhonchi. No wheezes.  ABDOMEN: Soft, flat, nontender, nondistended. Has good bowel sounds. No hepatosplenomegaly appreciated.  EXTREMITIES: No evidence of any cyanosis, clubbing, or peripheral edema.  +2 pedal and  radial pulses bilaterally.  NEUROLOGIC: The patient is alert, no focal deficits SKIN: Moist and warm with no rashes appreciated.  Psych: Not anxious, depressed LN: No inguinal LN enlargement    Antibiotics   Anti-infectives (From admission, onward)   Start     Dose/Rate Route Frequency Ordered Stop   08/21/18 2100  azithromycin (ZITHROMAX) tablet 250 mg     250 mg Oral Daily 08/21/18 0308 08/25/18 2059   08/21/18 0245  cefTRIAXone (ROCEPHIN) 1 g in sodium chloride 0.9 % 100 mL IVPB  Status:  Discontinued     1 g 200 mL/hr over 30  Minutes Intravenous Every 24 hours 08/21/18 0241 08/21/18 0259   08/21/18 0241  azithromycin (ZITHROMAX) 250 mg in dextrose 5 % 125 mL IVPB  Status:  Discontinued     250 mg 125 mL/hr over 60 Minutes Intravenous Every 24 hours 08/21/18 0241 08/21/18 0308   08/20/18 2015  cefTRIAXone (ROCEPHIN) 2 g in sodium chloride 0.9 % 100 mL IVPB     2 g 200 mL/hr over 30 Minutes Intravenous Every 24 hours 08/20/18 2006     08/20/18 2015  azithromycin (ZITHROMAX) 500 mg in sodium chloride 0.9 % 250 mL IVPB  Status:  Discontinued     500 mg 250 mL/hr over 60 Minutes Intravenous Every 24 hours 08/20/18 2006 08/21/18 0300      Medications   Scheduled Meds: . aspirin  81 mg Oral Daily  . azithromycin  250 mg Oral Daily  . cholecalciferol  1,000 Units Oral Daily  . citalopram  20 mg Oral Daily  . dutasteride  0.5 mg Oral Daily  . insulin aspart  0-5 Units Subcutaneous QHS  . insulin aspart  0-9 Units Subcutaneous TID WC  . metoprolol succinate  12.5 mg Oral Daily  . pantoprazole  40 mg Oral Daily  . QUEtiapine  12.5 mg Oral QHS  . traZODone  50 mg Oral QPM   Continuous Infusions: . cefTRIAXone (ROCEPHIN)  IV 2 g (08/21/18 2135)  . heparin 1,200 Units/hr (08/21/18 1818)   PRN Meds:.acetaminophen **OR** acetaminophen, guaiFENesin, ondansetron **OR** ondansetron (ZOFRAN) IV, polyethylene glycol, traMADol   Data Review:   Micro Results Recent Results (from the  past 240 hour(s))  Urine culture     Status: None (Preliminary result)   Collection Time: 08/20/18  7:19 PM   Specimen: Urine, Random  Result Value Ref Range Status   Specimen Description   Final    URINE, RANDOM Performed at [facility], [address] [place], [place] [address]    Special Requests   Final    NONE Performed at [facility], [address], [place], [place] [address]    Culture   Final    CULTURE REINCUBATED FOR BETTER GROWTH Performed at [facility], [address] [place], [place] [address]    Report Status PENDING  Incomplete  SARS Coronavirus 2 (CEPHEID - Performed in [facility] hospital lab), Hosp Order     Status: None   Collection Time: 08/20/18  7:19 PM   Specimen: Nasopharyngeal Swab  Result Value Ref Range Status   SARS Coronavirus 2 NEGATIVE NEGATIVE Final    Comment: (NOTE) If result is NEGATIVE SARS-CoV-2 target nucleic acids are NOT DETECTED. The SARS-CoV-2 RNA is generally detectable in upper and lower  respiratory specimens during the acute phase of infection. The lowest  concentration of SARS-CoV-2 viral copies this assay can detect is 250  copies / mL. A negative result does not preclude SARS-CoV-2 infection  and should not be used as the sole basis for treatment or other  patient management decisions.  A negative result may occur with  improper specimen collection / handling, submission of specimen other  than nasopharyngeal swab, presence of viral mutation(s) within the  areas targeted by this assay, and inadequate number of viral copies  (<250 copies / mL). A negative result must be combined with clinical  observations, patient history, and epidemiological information. If result is POSITIVE SARS-CoV-2 target nucleic acids are DETECTED. The SARS-CoV-2 RNA is generally detectable in upper and lower  respiratory specimens dur ing  the acute phase of infection.  Positive  results are indicative of active  infection with SARS-CoV-2.  Clinical  correlation with patient history and other diagnostic information is  necessary to determine patient infection status.  Positive results do  not rule out bacterial infection or co-infection with other viruses. If result is PRESUMPTIVE POSTIVE SARS-CoV-2 nucleic acids MAY BE PRESENT.   A presumptive positive result was obtained on the submitted specimen  and confirmed on repeat testing.  While 2019 novel coronavirus  (SARS-CoV-2) nucleic acids may be present in the submitted sample  additional confirmatory testing may be necessary for epidemiological  and / or clinical management purposes  to differentiate between  SARS-CoV-2 and other Sarbecovirus currently known to infect humans.  If clinically indicated additional testing with an alternate test  methodology [phone]([id number]) is advised. The SARS-CoV-2 RNA is generally  detectable in upper and lower respiratory sp ecimens during the acute  phase of infection. The expected result is Negative. Fact Sheet for Patients:  BoilerBrush.com.cyhttps://www.fda.gov/media/136312/download Fact Sheet for Healthcare Providers: https://pope.com/https://www.fda.gov/media/136313/download This test is not yet approved or cleared by the [place] FDA and has been authorized for detection and/or diagnosis of SARS-CoV-2 by FDA under an Emergency Use Authorization (EUA).  This EUA will remain in effect (meaning this test can be used) for the duration of the COVID-19 declaration under Section 564(b)(1) of the Act, 21 U.S.C. section 360bbb-3(b)(1), unless the authorization is terminated or revoked sooner. Performed at [facility], [address] [place], [place] [address]   Blood Culture (routine x 2)     Status: None (Preliminary result)   Collection Time: 08/20/18  7:20 PM   Specimen: BLOOD  Result Value Ref Range Status   Specimen Description BLOOD RIGHT ARM  Final   Special Requests   Final    BOTTLES DRAWN AEROBIC AND ANAEROBIC Blood  Culture results may not be optimal due to an excessive volume of blood received in culture bottles   Culture   Final    NO GROWTH 2 DAYS Performed at [facility], [address] [place], [place] [address]    Report Status PENDING  Incomplete  Blood Culture (routine x 2)     Status: None (Preliminary result)   Collection Time: 08/20/18  7:20 PM   Specimen: BLOOD  Result Value Ref Range Status   Specimen Description BLOOD LEFT WRIST  Final   Special Requests   Final    BOTTLES DRAWN AEROBIC AND ANAEROBIC Blood Culture adequate volume   Culture   Final    NO GROWTH 2 DAYS Performed at [facility], [address] [place], [place] [address]    Report Status PENDING  Incomplete    Radiology Reports Dg Chest 1 View  Result Date: 07/28/2018 CLINICAL DATA:  Unwitnessed fall. EXAM: CHEST  1 VIEW COMPARISON:  Radiograph of June 08, 2017. FINDINGS: Stable cardiomegaly. Atherosclerosis of thoracic aorta is noted. No pneumothorax is noted. Right lung is clear. Possible mild left basilar atelectasis is noted with associated effusion. Bony thorax is unremarkable. IMPRESSION: Possible mild left basilar atelectasis is noted with small pleural effusion. Aortic Atherosclerosis (ICD10-I70.0). Electronically Signed   By: Lupita RaiderJames  Green Jr M.D.   On: 07/28/2018 16:19   Dg Pelvis 1-2 Views  Result Date: 07/28/2018 CLINICAL DATA:  Unwitnessed fall. EXAM: PELVIS - 1-2 VIEW COMPARISON:  None. FINDINGS: There is no evidence of pelvic fracture or diastasis. No pelvic bone lesions are seen. Suprapubic catheter is noted. IMPRESSION: No acute abnormality  seen in the pelvis. Electronically Signed   By: Marijo Conception M.D.   On: 07/28/2018 16:20   Ct Head Wo Contrast  Result Date: 07/28/2018 CLINICAL DATA:  Fall EXAM: CT HEAD WITHOUT CONTRAST CT MAXILLOFACIAL WITHOUT CONTRAST CT CERVICAL SPINE WITHOUT CONTRAST TECHNIQUE: Multidetector CT imaging of the head, cervical spine, and maxillofacial  structures were performed using the standard protocol without intravenous contrast. Multiplanar CT image reconstructions of the cervical spine and maxillofacial structures were also generated. COMPARISON:  None. FINDINGS: CT HEAD FINDINGS Brain: There is no mass, hemorrhage or extra-axial collection. There is generalized atrophy without lobar predilection. There is hypoattenuation of the periventricular white matter, most commonly indicating chronic ischemic microangiopathy. Vascular: No abnormal hyperdensity of the major intracranial arteries or dural venous sinuses. No intracranial atherosclerosis. Skull: Small left frontal scalp laceration.  No skull fracture. CT MAXILLOFACIAL FINDINGS Osseous: --Complex facial fracture types: No LeFort, zygomaticomaxillary complex or nasoorbitoethmoidal fracture. --Simple fracture types: None. --Mandible: No fracture or dislocation. Orbits: The globes are intact. Normal appearance of the intra- and extraconal fat. Symmetric extraocular muscles and optic nerves. Sinuses: No fluid levels or advanced mucosal thickening. Soft tissues: Small left periorbital soft tissue hematoma. CT CERVICAL SPINE FINDINGS Alignment: No static subluxation. Facets are aligned. Occipital condyles and the lateral masses of C1-C2 are aligned. Skull base and vertebrae: No acute fracture. Soft tissues and spinal canal: No prevertebral fluid or swelling. No visible canal hematoma. Disc levels: Multilevel facet hypertrophy greatest at C3-5. No bony spinal canal stenosis. Upper chest: No pneumothorax, pulmonary nodule or pleural effusion. Other: Normal visualized paraspinal cervical soft tissues. IMPRESSION: 1. No acute intracranial abnormality. 2. Small left frontal scalp laceration and left periorbital soft tissue hematoma. No facial fracture. 3. No acute fracture or static subluxation of the cervical spine. Electronically Signed   By: Ulyses Jarred M.D.   On: 07/28/2018 16:14   Ct Cervical Spine Wo  Contrast  Result Date: 07/28/2018 CLINICAL DATA:  Fall EXAM: CT HEAD WITHOUT CONTRAST CT MAXILLOFACIAL WITHOUT CONTRAST CT CERVICAL SPINE WITHOUT CONTRAST TECHNIQUE: Multidetector CT imaging of the head, cervical spine, and maxillofacial structures were performed using the standard protocol without intravenous contrast. Multiplanar CT image reconstructions of the cervical spine and maxillofacial structures were also generated. COMPARISON:  None. FINDINGS: CT HEAD FINDINGS Brain: There is no mass, hemorrhage or extra-axial collection. There is generalized atrophy without lobar predilection. There is hypoattenuation of the periventricular white matter, most commonly indicating chronic ischemic microangiopathy. Vascular: No abnormal hyperdensity of the major intracranial arteries or dural venous sinuses. No intracranial atherosclerosis. Skull: Small left frontal scalp laceration.  No skull fracture. CT MAXILLOFACIAL FINDINGS Osseous: --Complex facial fracture types: No LeFort, zygomaticomaxillary complex or nasoorbitoethmoidal fracture. --Simple fracture types: None. --Mandible: No fracture or dislocation. Orbits: The globes are intact. Normal appearance of the intra- and extraconal fat. Symmetric extraocular muscles and optic nerves. Sinuses: No fluid levels or advanced mucosal thickening. Soft tissues: Small left periorbital soft tissue hematoma. CT CERVICAL SPINE FINDINGS Alignment: No static subluxation. Facets are aligned. Occipital condyles and the lateral masses of C1-C2 are aligned. Skull base and vertebrae: No acute fracture. Soft tissues and spinal canal: No prevertebral fluid or swelling. No visible canal hematoma. Disc levels: Multilevel facet hypertrophy greatest at C3-5. No bony spinal canal stenosis. Upper chest: No pneumothorax, pulmonary nodule or pleural effusion. Other: Normal visualized paraspinal cervical soft tissues. IMPRESSION: 1. No acute intracranial abnormality. 2. Small left frontal scalp  laceration and left periorbital soft tissue hematoma. No  facial fracture. 3. No acute fracture or static subluxation of the cervical spine. Electronically Signed   By: Deatra Robinson M.D.   On: 07/28/2018 16:14   Dg Chest Port 1 View  Result Date: 08/20/2018 CLINICAL DATA:  Altered mental status EXAM: PORTABLE CHEST 1 VIEW COMPARISON:  07/28/2018, 06/08/2017 FINDINGS: Right lung is grossly clear. Enlarged cardiomediastinal silhouette. Suspected airspace disease at the lingula and left base, not significantly changed. Aortic atherosclerosis. No pneumothorax. IMPRESSION: 1. Suspected airspace disease at the lingula and left base which may reflect atelectasis or pneumonia 2. Mild cardiomegaly Electronically Signed   By: Jasmine Pang M.D.   On: 08/20/2018 19:35   Ct Maxillofacial Wo Contrast  Result Date: 07/28/2018 CLINICAL DATA:  Fall EXAM: CT HEAD WITHOUT CONTRAST CT MAXILLOFACIAL WITHOUT CONTRAST CT CERVICAL SPINE WITHOUT CONTRAST TECHNIQUE: Multidetector CT imaging of the head, cervical spine, and maxillofacial structures were performed using the standard protocol without intravenous contrast. Multiplanar CT image reconstructions of the cervical spine and maxillofacial structures were also generated. COMPARISON:  None. FINDINGS: CT HEAD FINDINGS Brain: There is no mass, hemorrhage or extra-axial collection. There is generalized atrophy without lobar predilection. There is hypoattenuation of the periventricular white matter, most commonly indicating chronic ischemic microangiopathy. Vascular: No abnormal hyperdensity of the major intracranial arteries or dural venous sinuses. No intracranial atherosclerosis. Skull: Small left frontal scalp laceration.  No skull fracture. CT MAXILLOFACIAL FINDINGS Osseous: --Complex facial fracture types: No LeFort, zygomaticomaxillary complex or nasoorbitoethmoidal fracture. --Simple fracture types: None. --Mandible: No fracture or dislocation. Orbits: The globes are intact.  Normal appearance of the intra- and extraconal fat. Symmetric extraocular muscles and optic nerves. Sinuses: No fluid levels or advanced mucosal thickening. Soft tissues: Small left periorbital soft tissue hematoma. CT CERVICAL SPINE FINDINGS Alignment: No static subluxation. Facets are aligned. Occipital condyles and the lateral masses of C1-C2 are aligned. Skull base and vertebrae: No acute fracture. Soft tissues and spinal canal: No prevertebral fluid or swelling. No visible canal hematoma. Disc levels: Multilevel facet hypertrophy greatest at C3-5. No bony spinal canal stenosis. Upper chest: No pneumothorax, pulmonary nodule or pleural effusion. Other: Normal visualized paraspinal cervical soft tissues. IMPRESSION: 1. No acute intracranial abnormality. 2. Small left frontal scalp laceration and left periorbital soft tissue hematoma. No facial fracture. 3. No acute fracture or static subluxation of the cervical spine. Electronically Signed   By: Deatra Robinson M.D.   On: 07/28/2018 16:14     CBC Recent Labs  Lab 08/20/18 1919 08/21/18 0552 08/22/18 0542  WBC 10.6* 11.2* 10.2  HGB 11.6* 10.6* 10.4*  HCT 37.6* 35.5* 34.8*  PLT 197 171 165  MCV 92.2 92.9 94.1  MCH 28.4 27.7 28.1  MCHC 30.9 29.9* 29.9*  RDW 14.8 14.8 14.8  LYMPHSABS 5.5*  --   --   MONOABS 0.8  --   --   EOSABS 0.3  --   --   BASOSABS 0.1  --   --     Chemistries  Recent Labs  Lab 08/20/18 1919 08/21/18 0552  NA 141 143  K 4.1 4.1  CL 108 112*  CO2 22 22  GLUCOSE 93 83  BUN 32* 28*  CREATININE 1.75* 1.62*  CALCIUM 8.2* 7.8*  MG 1.9  --   AST 21  --   ALT 12  --   ALKPHOS 60  --   BILITOT 0.4  --    ------------------------------------------------------------------------------------------------------------------ estimated creatinine clearance is 31.5 mL/min (A) (by C-G formula based on SCr of 1.62 mg/dL  (  H)). ------------------------------------------------------------------------------------------------------------------ No results for input(s): HGBA1C in the last 72 hours. ------------------------------------------------------------------------------------------------------------------ No results for input(s): CHOL, HDL, LDLCALC, TRIG, CHOLHDL, LDLDIRECT in the last 72 hours. ------------------------------------------------------------------------------------------------------------------ No results for input(s): TSH, T4TOTAL, T3FREE, THYROIDAB in the last 72 hours.  Invalid input(s): FREET3 ------------------------------------------------------------------------------------------------------------------ No results for input(s): VITAMINB12, FOLATE, FERRITIN, TIBC, IRON, RETICCTPCT in the last 72 hours.  Coagulation profile Recent Labs  Lab 08/20/18 1919  INR 1.0    No results for input(s): DDIMER in the last 72 hours.  Cardiac Enzymes No results for input(s): CKMB, TROPONINI, MYOGLOBIN in the last 168 hours.  Invalid input(s): CK ------------------------------------------------------------------------------------------------------------------ Invalid input(s): POCBNP    Assessment & Plan   85 y.o. male with a known history of BPH, chronic kidney disease stage III, diabetes, GERD, hypertension, osteoporosis,, previous CVA, previous history of ESBL UTI who presents to the hospital due to weakness, altered mental status.  1.  Altered mental status/lethargy- metabolic encephalopathy secondary to dehydration and pneumonia.   Patient's mental status is improved.  Continue current antibiotic  2.  Pneumonia- incidentally noted on chest x-ray on admission.   Procalcitonin level is low  3.  Recent ESBL UTI- patient had a multidrug-resistant Klebsiella UTI last month.  Patient has been treated with IV meropenem as per the patient's daughter. -Patient's urinalysis is still  positive.   Continue ceftriaxone azithromycin for now await final urine cultures4.  Dehydration-secondary to poor p.o. intake.  We will gently hydrate the patient with IV fluids, follow BUN/creatinine urine output.  5.  BPH- patient has a chronic suprapubic catheter.  Patient is followed by Dr. Lonna CobbStoioff.  He recently had a suprapubic catheter change. - Continue Avodart.  6.  Atrial flutter- Normal rhythm sinus rhythm now is having bradycardia I will discontinue heparin cardiology consult   7.  Diabetes- we will place the patient on sliding scale insulin.  8.  GERD-continue Protonix.     Code Status Orders  (From admission, onward)         Start     Ordered   08/21/18 0242  Do not attempt resuscitation (DNR)  Continuous    Question Answer Comment  In the event of cardiac or respiratory ARREST Do not call a "code blue"   In the event of cardiac or respiratory ARREST Do not perform Intubation, CPR, defibrillation or ACLS   In the event of cardiac or respiratory ARREST Use medication by any route, position, wound care, and other measures to relive pain and suffering. May use oxygen, suction and manual treatment of airway obstruction as needed for comfort.   Comments Nurse to pronounce      08/21/18 0241        Code Status History    Date Active Date Inactive Code Status Order ID Comments User Context   06/08/2017 2119 06/12/2017 2144 DNR [id number]  SalaryEvelena Asa, Montell D, MD Inpatient   Advance Care Planning Activity    Advance Directive Documentation     Most Recent Value  Type of Advance Directive  Out of facility DNR (pink MOST or yellow form)  Pre-existing out of facility DNR order (yellow form or pink MOST form)  Physician notified to receive inpatient order  "MOST" Form in Place?  -           Consults none   DVT Prophylaxis Heparin  Lab Results  Component Value Date   PLT 165 08/22/2018     Time Spent in minutes 35min Greater than 50% of time spent in care  coordination and counseling patient regarding the condition and plan of care.   Auburn BilberryShreyang Karita Dralle M.D on 08/22/2018 at 12:48 PM  Between 7am to 6pm - Pager - [phone]  After 6pm go to [email or web address] - Social research officer, [email or web address] [email or web address]  [employer]   Office  [phone]

## 2018-08-22 NOTE — Consult Note (Addendum)
[place] Nurse ostomy consult note Stoma type/location: Patient with chronic suprapubic catheter, followed by Dr. Bernardo Heater in the community. Catheter recently changed. With confirmed UTI.  Antibiotics in progress, second course. Order for peritube drainage, odor. Orders placed for routine catheter care to begin today.  No role for WOC nurse at this time. Please consult with Urology for other tube related concerns as needed.  [place] nursing team will not follow, but will remain available to this patient, the nursing and medical teams.  Please re-consult if needed. Thanks, Maudie Flakes, MSN, RN, [place], Arther Abbott  Pager# [phone]

## 2018-08-22 NOTE — Progress Notes (Signed)
ANTICOAGULATION CONSULT NOTE  Pharmacy Consult for heparin Indication: atrial fibrillation  Patient Measurements: Height: 5\' 9"  (175.3 cm) Weight: 172 lb 1.6 oz (78.1 kg) IBW/kg (Calculated) : 70.7 Heparin Dosing Weight: 85 kg  Vital Signs: Temp: 98.1 F (36.7 C) (07/04 2009) Temp Source: Oral (07/04 2009) BP: 132/64 (07/05 0356) Pulse Rate: 42 (07/05 0356)  Labs: Recent Labs    08/20/18 1919 08/21/18 0552 08/21/18 1512 08/22/18 0542  HGB 11.6* 10.6*  --  10.4*  HCT 37.6* 35.5*  --  34.8*  PLT 197 171  --  165  APTT 35  --   --   --   LABPROT 13.5  --   --   --   INR 1.0  --   --   --   HEPARINUNFRC  --  0.54 0.61 0.58  CREATININE 1.75* 1.62*  --   --     Estimated Creatinine Clearance: 31.5 mL/min (A) (by C-G formula based on SCr of 1.62 mg/dL (H)).   Medical History: Past Medical History:  Diagnosis Date  . BPH (benign prostatic hyperplasia)   . Chronic kidney disease   . Constipation   . Depression   . Diabetes ([place])   . Dysphagia   . Edema   . GERD (gastroesophageal reflux disease)   . Hypertension   . Hypokalemia   . Iron deficiency anemia   . Osteoporosis   . Pressure ulcer    right heel, stage 3 per  . Stroke ([place])   . UTI (urinary tract infection)     Medications:  Scheduled:  . aspirin  81 mg Oral Daily  . azithromycin  250 mg Oral Daily  . cholecalciferol  1,000 Units Oral Daily  . citalopram  20 mg Oral Daily  . dutasteride  0.5 mg Oral Daily  . insulin aspart  0-5 Units Subcutaneous QHS  . insulin aspart  0-9 Units Subcutaneous TID WC  . metoprolol succinate  12.5 mg Oral Daily  . pantoprazole  40 mg Oral Daily  . QUEtiapine  12.5 mg Oral QHS  . traZODone  50 mg Oral QPM    Assessment: Patient admitted w/ SOB found to have new onset afib/aflutte on EKG w/ T-wave abnormalities and IVCD. Patient is not on anticoagulation PTA. Baseline labs WNL. Patient is being started on heparin drip for anticoagulation of afib.  Goal of  Therapy:  Heparin level 0.3-0.7 units/ml Monitor platelets by anticoagulation protocol: Yes   Heparin Course: 7/4 initiation: bolus 4000 units, then 1200 units/hr  7/4 0552: HL 0.54: no change 7/4 1512: HL 0.61: no change   Plan:  07/05 @ 0500 HL 0.58 therapeutic. Will continue rate at 1200 units/hr and will recheck HL w/ am labs, CBC stable will continue to monitor.  Tobie Lords, PharmD, BCPS Clinical Pharmacist 08/22/2018,6:56 AM

## 2018-08-22 NOTE — Plan of Care (Signed)

## 2018-08-23 ENCOUNTER — Encounter: Payer: Self-pay | Admitting: Physician Assistant

## 2018-08-23 ENCOUNTER — Other Ambulatory Visit: Payer: Self-pay

## 2018-08-23 ENCOUNTER — Inpatient Hospital Stay: Payer: Medicare Other

## 2018-08-23 DIAGNOSIS — R001 Bradycardia, unspecified: Secondary | ICD-10-CM

## 2018-08-23 DIAGNOSIS — I4892 Unspecified atrial flutter: Secondary | ICD-10-CM

## 2018-08-23 LAB — BASIC METABOLIC PANEL
Anion gap: 6 (ref 5–15)
BUN: 22 mg/dL (ref 8–23)
CO2: 28 mmol/L (ref 22–32)
Calcium: 7.9 mg/dL — ABNORMAL LOW (ref 8.9–10.3)
Chloride: 106 mmol/L (ref 98–111)
Creatinine, Ser: 1.4 mg/dL — ABNORMAL HIGH (ref 0.61–1.24)
GFR calc Af Amer: 52 mL/min — ABNORMAL LOW (ref >60)
GFR calc non Af Amer: 45 mL/min — ABNORMAL LOW (ref >60)
Glucose, Bld: 87 mg/dL (ref 70–99)
Potassium: 3.8 mmol/L (ref 3.5–5.1)
Sodium: 140 mmol/L (ref 135–145)

## 2018-08-23 LAB — GLUCOSE, CAPILLARY
Glucose-Capillary: 84 mg/dL (ref 70–99)
Glucose-Capillary: 85 mg/dL (ref 70–99)
Glucose-Capillary: 90 mg/dL (ref 70–99)
Glucose-Capillary: 97 mg/dL (ref 70–99)

## 2018-08-23 LAB — CBC
HCT: 38.9 % — ABNORMAL LOW (ref 39.0–52.0)
Hemoglobin: 11.8 g/dL — ABNORMAL LOW (ref 13.0–17.0)
MCH: 28.4 pg (ref 26.0–34.0)
MCHC: 30.3 g/dL (ref 30.0–36.0)
MCV: 93.7 fL (ref 80.0–100.0)
Platelets: 181 10*3/uL (ref 150–400)
RBC: 4.15 MIL/uL — ABNORMAL LOW (ref 4.22–5.81)
RDW: 14.9 % (ref 11.5–15.5)
WBC: 11.5 10*3/uL — ABNORMAL HIGH (ref 4.0–10.5)
nRBC: 0 % (ref 0.0–0.2)

## 2018-08-23 LAB — MAGNESIUM: Magnesium: 1.9 mg/dL (ref 1.7–2.4)

## 2018-08-23 LAB — TSH: TSH: 0.585 u[IU]/mL (ref 0.350–4.500)

## 2018-08-23 LAB — HEPARIN LEVEL (UNFRACTIONATED): Heparin Unfractionated: <0.1 IU/mL — ABNORMAL LOW (ref 0.30–0.70)

## 2018-08-23 LAB — PROCALCITONIN: Procalcitonin: <0.1 ng/mL

## 2018-08-23 MED ORDER — SODIUM CHLORIDE 0.9 % IV SOLN
INTRAVENOUS | Status: DC | PRN
  Administered 2018-08-23: 250 mL via INTRAVENOUS

## 2018-08-23 MED ORDER — ALPRAZOLAM 0.5 MG PO TABS
0.5000 mg | ORAL_TABLET | ORAL | Status: AC
  Administered 2018-08-23: 0.5 mg via ORAL
  Filled 2018-08-23: qty 1

## 2018-08-23 MED ORDER — ENOXAPARIN SODIUM 40 MG/0.4ML ~~LOC~~ SOLN
40.0000 mg | SUBCUTANEOUS | Status: DC
  Administered 2018-08-23: 40 mg via SUBCUTANEOUS
  Filled 2018-08-23: qty 0.4

## 2018-08-23 NOTE — Consult Note (Signed)
Cardiology Consultation:   Patient ID: Dustin Hicks; [id number]; 04/24/1928   Admit date: 08/20/2018 Date of Consult: 08/23/2018  Primary Care Provider: Patient, No Pcp Per Primary Cardiologist: [facility] - consult by Arida   Patient Profile:   Dustin Glazierrthur Burlingame is a 85 y.o. male with a hx of CVA, CKD stage III, large granular lymphocytosis, DM2, HTN, ESBL UTI, BPH, osteoporosis, and GERD who is being seen today for the evaluation of atrial flutter with slow ventricular response at the request of Dr. Allena KatzPatel.  History of Present Illness:   Mr. Dustin Hicks does not have any previously known cardiac history. He was recently seen in the ED on 07/28/2018 with a fall. EKG at that time showed Mobitz type I AV block, 49 bpm. Potassium was 4.8, SCr elevated from his baseline with CBC showing a persistent leukocytosis. CT head not acute.   He presented to the hospital on 7/3 with increased weakness and AMS. Other details are uncertain. In the ED, he was noted to be bradycardic and hypotensive which improved with IV fluids. Initially he was in sinus bradycardia with 1st degree AV block, however he developed Mobitz type I AV block followed by atrial flutter with slow ventricular response with variable AV block in the ED. He was given IV diltiazem 10 mg and continued on prior to admission Lopressor 12.5 mg with his last dose being given on 08/22/2018 at 9:04 AM. He has had 3 subsequent episodes of atrial flutter with slow ventricular response on 08/21/2018. Telemetry during the admission has shown Mobitz type I AV block with intermittent 2:1 AV block with no further atrial flutter. Longest R-R interval being 3 seconds at 5:11 AM this morning. With IV fluids and treatment of suspected PNA, his altered mental status has improved and per his daughter who is at bedside feels like is at his baseline.     Past Medical History:  Diagnosis Date  . BPH (benign prostatic hyperplasia)   . Chronic kidney disease   .  Constipation   . Depression   . Diabetes ([facility])   . Dysphagia   . Edema   . GERD (gastroesophageal reflux disease)   . Hypertension   . Hypokalemia   . Iron deficiency anemia   . Osteoporosis   . Pressure ulcer    right heel, stage 3 per  . Stroke ([facility])   . UTI (urinary tract infection)     Past Surgical History:  Procedure Laterality Date  . IR CATHETER TUBE CHANGE  06/11/2018  . IR CATHETER TUBE CHANGE  07/09/2018  . SUPRAPUBIC CATHETER INSERTION       Home Meds: Prior to Admission medications   Medication Sig Start Date End Date Taking? Authorizing Provider  acetaminophen (TYLENOL) 325 MG tablet Take 650 mg by mouth every 4 (four) hours as needed for mild pain or fever.   Yes [provider]  acetaminophen (TYLENOL) 500 MG tablet Take 1,000 mg by mouth every 8 (eight) hours as needed for moderate pain.    Yes [provider]  acetic acid 0.25 % irrigation Irrigate with 1 application as directed at bedtime.   Yes [provider]  aspirin 81 MG chewable tablet Chew 81 mg by mouth daily.   Yes [provider]  calcium carbonate (TUMS - DOSED IN MG ELEMENTAL CALCIUM) 500 MG chewable tablet Chew 1 tablet by mouth daily.   Yes [provider]  Cholecalciferol 25 MCG (1000 UT) capsule Take 1,000 Units by mouth  daily.   Yes [provider]  citalopram (CELEXA) 20 MG tablet Take 20 mg by mouth daily.   Yes [provider]  dutasteride (AVODART) 0.5 MG capsule Take 0.5 mg by mouth daily.   Yes [provider]  ferrous sulfate 220 (44 Fe) MG/5ML solution Take 330 mg by mouth daily.   Yes [provider]  furosemide (LASIX) 20 MG tablet Take 20 mg by mouth daily.    Yes [provider]  guaiFENesin (ROBITUSSIN) 100 MG/5ML SOLN Take 10 mLs by mouth every 4 (four) hours as needed for cough or to loosen phlegm.   Yes [provider]  insulin glargine (LANTUS) 100 UNIT/ML injection Inject 0.1 mLs  (10 Units total) into the skin at bedtime. Patient taking differently: Inject 5 Units into the skin at bedtime.  06/12/17  Yes Henreitta Leber, MD  metoprolol succinate (TOPROL-XL) 25 MG 24 hr tablet Take 12.5 mg by mouth daily.    Yes [provider]  pantoprazole (PROTONIX) 40 MG tablet Take 40 mg by mouth daily.    Yes [provider]  polyethylene glycol (MIRALAX / GLYCOLAX) packet Take 17 g by mouth daily as needed for mild constipation. 06/12/17  Yes Sainani, Belia Heman, MD  potassium chloride (K-DUR) 10 MEQ tablet Take 10 mEq by mouth daily.   Yes [provider]  QUEtiapine (SEROQUEL) 25 MG tablet Take 12.5 mg by mouth at bedtime.    Yes [provider]  Throat Lozenges (COUGH DROPS MENTHOL) LOZG Use as directed 1 lozenge in the mouth or throat 4 (four) times daily as needed (sore throat).   Yes [provider]  traMADol (ULTRAM) 50 MG tablet Take 1 tablet (50 mg total) by mouth 3 (three) times daily as needed for moderate pain. 06/12/17  Yes Henreitta Leber, MD  traZODone (DESYREL) 50 MG tablet Take 50 mg by mouth every evening.   Yes [provider]  zinc oxide 20 % ointment Apply 1 application topically as needed for irritation. Xerosis cutis   Yes [provider]    Inpatient Medications: Scheduled Meds: . aspirin  81 mg Oral Daily  . azithromycin  250 mg Oral Daily  . cholecalciferol  1,000 Units Oral Daily  . citalopram  20 mg Oral Daily  . dutasteride  0.5 mg Oral Daily  . insulin aspart  0-5 Units Subcutaneous QHS  . insulin aspart  0-9 Units Subcutaneous TID WC  . pantoprazole  40 mg Oral Daily  . QUEtiapine  12.5 mg Oral QHS  . traZODone  50 mg Oral QPM   Continuous Infusions: . cefTRIAXone (ROCEPHIN)  IV 2 g (08/22/18 2037)   PRN Meds: acetaminophen **OR** acetaminophen, guaiFENesin, ondansetron **OR** ondansetron (ZOFRAN) IV, polyethylene glycol, traMADol  Allergies:  No Known Allergies  Social History:    Social History   Socioeconomic History  . Marital status: Widowed    Spouse name: Not on file  . Number of children: Not on file  . Years of education: Not on file  . Highest education level: Not on file  Occupational History  . Not on file  Social Needs  . Financial resource strain: Not on file  . Food insecurity    Worry: Not on file    Inability: Not on file  . Transportation needs    Medical: Not on file    Non-medical: Not on file  Tobacco Use  . Smoking status: Never Smoker  . Smokeless tobacco: Never Used  Substance and Sexual Activity  . Alcohol use: Never    Frequency: Never  . Drug use: Never  . Sexual activity: Not on file  Lifestyle  . Physical activity    Days per week: Not on file    Minutes per session: Not on file  . Stress: Not on file  Relationships  . Social Musicianconnections    Talks on phone: Not on file    Gets together: Not on file    Attends religious service: Not on file    Active member of club or organization: Not on file    Attends meetings of clubs or organizations: Not on file    Relationship status: Not on file  . Intimate partner violence    Fear of current or ex partner: Not on file    Emotionally abused: Not on file    Physically abused: Not on file    Forced sexual activity: Not on file  Other Topics Concern  . Not on file  Social History Narrative   [facility]. Daughter reports no change in medical status since past admission (communication over phone due to Covid restrictions.) MAR reviewed from nursing center.      Family History:   Family History  Problem Relation Age of Onset  . Muscular dystrophy Daughter     ROS:  Review of Systems  Unable to perform ROS: Mental acuity      Physical Exam/Data:   Vitals:   08/22/18 1935 08/23/18 0437 08/23/18 0754 08/23/18 0940  BP: (!) 154/90 136/61 (!) 179/60 (!) 170/66  Pulse: (!) 53 (!) 53 (!) 43 (!) 55  Resp: 19 18 16    Temp: 97.8 F  (36.6 C) 97.7 F (36.5 C) (!) 97.5 F (36.4 C)   TempSrc:   Oral   SpO2: 99% 94% 100%   Weight:      Height:        Intake/Output Summary (Last 24 hours) at 08/23/2018 1002 Last data filed at 08/23/2018 0500 Gross per 24 hour  Intake 812.77 ml  Output 2050 ml  Net -1237.23 ml   Filed Weights   08/20/18 1916 08/21/18 0235  Weight: 85 kg 78.1 kg   Body mass index is 25.41 kg/m.   Physical Exam: General: Elderly appearing, in no acute distress. Head: Normocephalic, contusion noted along the right eyebrow, no xanthomas, nares without discharge.  Neck: Negative for carotid bruits. JVD not elevated. Lungs: Diminished breath sounds anteriorly given somnolence/sleeping. Breathing is unlabored. Heart: Bradycardic with S1 S2. III/VI systolic murmur RUSB, no rubs, or gallops appreciated. Abdomen: Soft, non-tender, non-distended with normoactive bowel sounds. No hepatomegaly. No rebound/guarding. No obvious abdominal masses. Msk:  Strength and tone appear normal for age. Extremities: No clubbing or cyanosis. No edema. Distal pedal pulses are 2+ and equal bilaterally. Neuro: Sleeping. No facial asymmetry. No focal deficit.  Psych:  Sleeping.   EKG:  The EKG was personally reviewed and demonstrates: 7/3 at 19;12 - sinus bradycardia, 48 bpm, 1st degree AV block, nonspecific IVCD, low voltage QRS along the precordial leads, nonspecific st/t changes. 7/3 at 21:14 - atrial flutter with slow ventricular response and variable AV block, 57 bpm, rare PVC, nonspecific IVCD, nonspecific st/t changes. 08/23/2018 at 10:20 AM - sinus bradycardia, 40 bpm, 1st degree AV block, no acute st/t changes Telemetry:  Telemetry was personally reviewed and demonstrates: sinus bradycardia, Mobitz type I AV block with intermittent 2:1 AV block, brief episodes of atrial flutter with slow  ventricular response, longest R-R interval 3.0 seconds at 5:11 AM this morning   Weights: Filed Weights   08/20/18 1916 08/21/18 0235   Weight: 85 kg 78.1 kg    Relevant CV Studies: 2D Echo 08/21/2018: 1. The left ventricle has normal systolic function, with an ejection fraction of 55-60%. The cavity size was normal. There is moderate concentric left ventricular hypertrophy with severe basal septal hypertrophy.  2. The right ventricle has normal systolic function. The cavity was normal. There is no increase in right ventricular wall thickness. Right ventricular systolic pressure could not be assessed.  3. Left atrial size was mildly dilated.  4. Severe mitral annular dilatation.  5. The aortic valve is abnormal. Moderate thickening of the aortic valve. Severe calcifcation of the aortic valve. Moderate-severe stenosis of the aortic valve. Possible low flow low gradient severe stenosis given valve area below 0.8 .  6. The tricuspid valve is grossly normal.  7. The mitral valve is abnormal. Mitral valve regurgitation is moderate by color flow Doppler.  8. The inferior vena cava was dilated in size with >50% respiratory variability.  Laboratory Data:  Chemistry Recent Labs  Lab 08/20/18 1919 08/21/18 0552  NA 141 143  K 4.1 4.1  CL 108 112*  CO2 22 22  GLUCOSE 93 83  BUN 32* 28*  CREATININE 1.75* 1.62*  CALCIUM 8.2* 7.8*  GFRNONAA 34* 37*  GFRAA 39* 43*  ANIONGAP 11 9    Recent Labs  Lab 08/20/18 1919  PROT 6.9  ALBUMIN 3.2*  AST 21  ALT 12  ALKPHOS 60  BILITOT 0.4   Hematology Recent Labs  Lab 08/21/18 0552 08/22/18 0542 08/23/18 0539  WBC 11.2* 10.2 11.5*  RBC 3.82* 3.70* 4.15*  HGB 10.6* 10.4* 11.8*  HCT 35.5* 34.8* 38.9*  MCV 92.9 94.1 93.7  MCH 27.7 28.1 28.4  MCHC 29.9* 29.9* 30.3  RDW 14.8 14.8 14.9  PLT 171 165 181   Cardiac EnzymesNo results for input(s): TROPONINI in the last 168 hours. No results for input(s): TROPIPOC in the last 168 hours.  BNPNo results for input(s): BNP, PROBNP in the last 168 hours.  DDimer No results for input(s): DDIMER in the last 168 hours.   Radiology/Studies:  Dg Chest 1 View  Result Date: 08/23/2018 IMPRESSION: 1. Persistent atelectasis and/or consolidation in the left lower lobe with probable small left pleural effusion. 2. Aortic atherosclerosis. 3. Mild cardiomegaly. Electronically Signed   By: Trudie Reedaniel  Entrikin M.D.   On: 08/23/2018 08:20   Dg Chest Port 1 View  Result Date: 08/20/2018 IMPRESSION: 1. Suspected airspace disease at the lingula and left base which may reflect atelectasis or pneumonia 2. Mild cardiomegaly Electronically Signed   By: Jasmine PangKim  Fujinaga M.D.   On: 08/20/2018 19:35    Assessment and Plan:   1. Atrial flutter with slow ventricular response: -Brief episode in the ED with 3 subsequent episodes on 7/4  -Currently in sinus bradycardia with 1st degree AV blcok -No longer on rate limiting medications as below -CHADS2VASc at least 7 (HTN, age x 2, DM, stroke x 2, vascular disease) -Briefly on heparin, though this was discontinued on 08/22/2018 -Uncertain if he would be a good anticoagulation candidate given recent fall and now with admission for AMS   2. Sinus bradycardia/Mobitz type I AV block with intermittent 2:1 AV block: -Last dose of Lopressor 12.5 mg on 08/22/2018 at 9:04 AM -Last dose of IV diltiazem 10 mg IV on 7/3 -Repeat EKG as above -Continue to let  Lopressor wash out -Ambulate when able to assess for appropriate chronotropic response -Avoid AV nodal blocking agents -No evidence of 3rd degree AV block at this time -No indication for emergent PPM implantation at this time -BP is stable, though if this becomes unstable, may need dopamine vs temp wire -Potassium at goal on 4.1 on 7/4, trend with recommendation to maintain a potassium of 4.0 -Magnesium 1.9 upon admission, trend -Check TSH -Snoring, cannot rule out underlying sleep apnea  3. AMS/lethargy: -Felt to be metabolic encephalopathy in the setting of dehydration and PNA -Improved -Unlikely related to his AV block given this was present  in 07/2018 ED visit without mention of AMS as well as in the setting of the patient's daughter who is in the room at this time who feels like he is at his baseline mentally, when awake -He is somnolent this morning, received Xanax overnight  4. Moderate to severe aortic stenosis with moderate mitral regurgitation: -At least moderate aortic stenosis as above -Unlikely to be a good candidate for invasive intervention given his advanced age and comorbid conditions -Daughter is leaning towards conservative approach given the patient's age and comorbid conditions   5. Suspected PNA: -ABX per IM  6. Normocytic anemia:  -HGB stable -Likely in the setting of chronic disease -Monitor  7. CKD stage III: -Renal function has improved with IV fluids and is now at his ~ baseline    For questions or updates, please contact [facility] Please consult [email or web address] for contact info under Cardiology/STEMI.   Signed, Eula Listen, PA-C [facility] Pager: [phone] 08/23/2018, 10:02 AM

## 2018-08-23 NOTE — NC FL2 (Signed)
[place] LEVEL OF CARE SCREENING TOOL     IDENTIFICATION  Patient Name: Dustin Hicks Birthdate: [birth year] Sex: male Admission Date (Current Location): 08/20/2018  [place] and [place] Number:  Engineering geologist and Address:  [facility], [address] [place], [place] [address]      Provider Number: [id number]  Attending Physician Name and Address:  Sela Hua, MD  Relative Name and Phone Number:       Current Level of Care: Hospital Recommended Level of Care: [place] Prior Approval Number:    Date Approved/Denied:   PASRR Number: [id number]  Discharge Plan: SNF    Current Diagnoses: Patient Active Problem List   Diagnosis Date Noted  . Pneumonia 08/20/2018  . Urinary retention 11/03/2017  . Pressure injury of skin 06/09/2017  . Complicated UTI (urinary tract infection) 06/08/2017  . Chronic kidney disease 11/11/2016  . Lymphocytosis 11/11/2016  . Type 2 diabetes mellitus, with long-term current use of insulin ([place]) 11/11/2016    Orientation RESPIRATION BLADDER Height & Weight     (Disoriented x4)  O2(2L) Incontinent Weight: 172 lb 1.6 oz (78.1 kg) Height:  5\' 9"  (175.3 cm)  BEHAVIORAL SYMPTOMS/MOOD NEUROLOGICAL BOWEL NUTRITION STATUS      Incontinent Diet(DYS 2)  AMBULATORY STATUS COMMUNICATION OF NEEDS Skin   Extensive Assist Verbally Other (Comment)(Unstaged pressure wound- heel; Stage 2 pressure wound Sacrum)                       Personal Care Assistance Level of Assistance  Bathing, Feeding, Dressing Bathing Assistance: Maximum assistance Feeding assistance: Maximum assistance Dressing Assistance: Maximum assistance     Functional Limitations Info             SPECIAL CARE FACTORS FREQUENCY                       Contractures      Additional Factors Info  Code Status, Allergies Code Status Info: DNR Allergies Info: NKA           Current Medications  (08/23/2018):  This is the current hospital active medication list Current Facility-Administered Medications  Medication Dose Route Frequency Provider Last Rate Last Dose  . acetaminophen (TYLENOL) tablet 650 mg  650 mg Oral Q6H PRN Henreitta Leber, MD       Or  . acetaminophen (TYLENOL) suppository 650 mg  650 mg Rectal Q6H PRN Henreitta Leber, MD      . aspirin chewable tablet 81 mg  81 mg Oral Daily Henreitta Leber, MD   81 mg at 08/23/18 0910  . azithromycin (ZITHROMAX) tablet 250 mg  250 mg Oral Daily Seals, Theo Dills, NP   250 mg at 08/22/18 2043  . cefTRIAXone (ROCEPHIN) 2 g in sodium chloride 0.9 % 100 mL IVPB  2 g Intravenous Q24H Lavonia Drafts, MD 200 mL/hr at 08/22/18 2037 2 g at 08/22/18 2037  . cholecalciferol (VITAMIN D3) tablet 1,000 Units  1,000 Units Oral Daily Henreitta Leber, MD   1,000 Units at 08/23/18 0910  . citalopram (CELEXA) tablet 20 mg  20 mg Oral Daily Henreitta Leber, MD   20 mg at 08/23/18 0910  . dutasteride (AVODART) capsule 0.5 mg  0.5 mg Oral Daily Henreitta Leber, MD   0.5 mg at 08/23/18 0910  . guaiFENesin (ROBITUSSIN) 100 MG/5ML solution 200 mg  10 mL Oral Q4H PRN Henreitta Leber, MD      .  insulin aspart (novoLOG) injection 0-5 Units  0-5 Units Subcutaneous QHS Sainani, Vivek J, MD      . insulin aspart (novoLOG) injection 0-9 Units  0-9 Units Subcutaneous TID WC [place] SirenSainani, Vivek J, MD   2 Units at 08/22/18 1813  . ondansetron (ZOFRAN) tablet 4 mg  4 mg Oral Q6H PRN [place] SirenSainani, Vivek J, MD       Or  . ondansetron (ZOFRAN) injection 4 mg  4 mg Intravenous Q6H PRN [place] SirenSainani, Vivek J, MD      . pantoprazole (PROTONIX) EC tablet 40 mg  40 mg Oral Daily [place] SirenSainani, Vivek J, MD   40 mg at 08/23/18 0910  . polyethylene glycol (MIRALAX / GLYCOLAX) packet 17 g  17 g Oral Daily PRN [place] SirenSainani, Vivek J, MD      . QUEtiapine (SEROQUEL) tablet 12.5 mg  12.5 mg Oral QHS [place] SirenSainani, Vivek J, MD   12.5 mg at 08/22/18 2100  . traMADol (ULTRAM) tablet 50 mg  50 mg Oral TID PRN [place] SirenSainani,  Vivek J, MD   50 mg at 08/22/18 2047  . traZODone (DESYREL) tablet 50 mg  50 mg Oral QPM [place] SirenSainani, Vivek J, MD   50 mg at 08/22/18 1813     Discharge Medications: Please see discharge summary for a list of discharge medications.  Relevant Imaging Results:  Relevant Lab Results:   Additional Information    Donnie CoffinErin M Daily Crate, LCSW

## 2018-08-23 NOTE — Progress Notes (Signed)
SLP Progress Note  Patient Details Name: Dustin Hicks MRN: [id number] DOB: 1928-04-01   Cancelled treatment:       Reason Eval/Treat Not Completed: Other (comment); Attempted to see pt at approximately 9 am this morning, daughter at bedside; however pt was very somnolent, she reported he was given xanax this morning and he is very drowsy. Re-attempted now at 12:30pm and daughter reported he just finished eating lunch and said "he did very well." She reported cough x1 throughout entire meal, she reported she didn't feel like it was related to swallowing.  Pt consumed 100% of his meal tray. Discussed pinching straw to limit size of sip for safer swallowing. Nsg reports toleration of diet with no overt s/s aspiration. Rec. Continue with Dysphagia II diet with nectar thick liquids, give meds crushed in puree. SLP to sign off, no further acute needs identified. Please re-consult with any future change in status requiring re-assessment.    Jupiter Boys, MA, CCC-SLP 08/23/2018, 12:30 PM

## 2018-08-23 NOTE — Progress Notes (Signed)
PT Cancellation Note  Patient Details Name: Dustin Hicks MRN: [id number] DOB: 09-Jul-1928   Cancelled Treatment:    Reason Eval/Treat Not Completed: PT screened, no needs identified, will sign off.  Per phone call with pt's daughter the pt is a LTC resident and is total assist at baseline including the need of a hoyer lift with all transfers.  Pt's daughter stated that the pt has received PT services in the past and refuses to participate with therapy.  Daughter stated that she does not feel that the pt is appropriate for PT services.  Will complete PT orders at this time, MD notified.        Linus Salmons PT, DPT 08/23/18, 2:14 PM

## 2018-08-23 NOTE — Progress Notes (Addendum)
[employer] - [place] [facility]                                                                                                                                                                                  Patient Demographics   Kam Rahimi, is a 85 y.o. male, DOB - 1928/06/01, YNW:[id number]  Admit date - 08/20/2018   Admitting Physician Houston Siren, MD  Outpatient Primary MD for the patient is Patient, No Pcp Per   LOS - 3  Subjective: Per daughter at bedside, patient seems to be at his mental status baseline.  He is becoming occasionally confused, such as thinking that his son who passed away is still alive.  No fevers overnight.  Patient remains on 2 L O2.   Review of Systems:   Unable to obtain secondary to baseline dementia  Vitals:   Vitals:   08/22/18 1935 08/23/18 0437 08/23/18 0754 08/23/18 0940  BP: (!) 154/90 136/61 (!) 179/60 (!) 170/66  Pulse: (!) 53 (!) 53 (!) 43 (!) 55  Resp: Temp: 97.8 F (36.6 C) 97.7 F (36.5 C) (!) 97.5 F (36.4 C)   TempSrc:   Oral   SpO2: 99% 94% 100%   Weight:      Height:        Wt Readings from Last 3 Encounters:  08/21/18 78.1 kg  07/28/18 80.3 kg  06/11/18 80.7 kg     Intake/Output Summary (Last 24 hours) at 08/23/2018 1439 Last data filed at 08/23/2018 1251 Gross per 24 hour  Intake 572.77 ml  Output 2700 ml  Net -2127.23 ml    Physical Exam:   GENERAL: Chronically ill-appearing HEAD, EYES, EARS, NOSE AND THROAT: Atraumatic, normocephalic. Extraocular muscles are intact. Pupils equal and reactive to light. Sclerae anicteric. No conjunctival injection. No oro-pharyngeal erythema.  NECK: Supple. There is no jugular venous distention. No bruits, no lymphadenopathy, no thyromegaly.  HEART: Bradycardic, regular rhythm, No murmurs, no rubs, no clicks.  LUNGS: Clear to auscultation bilaterally. No rales or rhonchi. No wheezes.  Nasal cannula in place. ABDOMEN: Soft, flat, nontender,  nondistended. Has good bowel sounds. No hepatosplenomegaly appreciated.  EXTREMITIES: No evidence of any cyanosis, clubbing, or peripheral edema.  +2 pedal and radial pulses bilaterally.  NEUROLOGIC: The patient is alert and oriented x1, no focal deficits SKIN: Moist and warm with no rashes appreciated.  Psych: Not anxious, depressed LN: No inguinal LN enlargement    Antibiotics   Anti-infectives (From admission, onward)   Start     Dose/Rate Route Frequency Ordered Stop   08/21/18 2100  azithromycin (ZITHROMAX) tablet 250 mg     250  mg Oral Daily 08/21/18 0308 08/25/18 2059   08/21/18 0245  cefTRIAXone (ROCEPHIN) 1 g in sodium chloride 0.9 % 100 mL IVPB  Status:  Discontinued     1 g 200 mL/hr over 30 Minutes Intravenous Every 24 hours 08/21/18 0241 08/21/18 0259   08/21/18 0241  azithromycin (ZITHROMAX) 250 mg in dextrose 5 % 125 mL IVPB  Status:  Discontinued     250 mg 125 mL/hr over 60 Minutes Intravenous Every 24 hours 08/21/18 0241 08/21/18 0308   08/20/18 2015  cefTRIAXone (ROCEPHIN) 2 g in sodium chloride 0.9 % 100 mL IVPB     2 g 200 mL/hr over 30 Minutes Intravenous Every 24 hours 08/20/18 2006     08/20/18 2015  azithromycin (ZITHROMAX) 500 mg in sodium chloride 0.9 % 250 mL IVPB  Status:  Discontinued     500 mg 250 mL/hr over 60 Minutes Intravenous Every 24 hours 08/20/18 2006 08/21/18 0300      Medications   Scheduled Meds: . aspirin  81 mg Oral Daily  . azithromycin  250 mg Oral Daily  . cholecalciferol  1,000 Units Oral Daily  . citalopram  20 mg Oral Daily  . dutasteride  0.5 mg Oral Daily  . enoxaparin (LOVENOX) injection  40 mg Subcutaneous Q24H  . insulin aspart  0-5 Units Subcutaneous QHS  . insulin aspart  0-9 Units Subcutaneous TID WC  . pantoprazole  40 mg Oral Daily  . QUEtiapine  12.5 mg Oral QHS  . traZODone  50 mg Oral QPM   Continuous Infusions: . cefTRIAXone (ROCEPHIN)  IV 2 g (08/22/18 2037)   PRN Meds:.acetaminophen **OR** acetaminophen,  guaiFENesin, ondansetron **OR** ondansetron (ZOFRAN) IV, polyethylene glycol, traMADol   Data Review:   Micro Results Recent Results (from the past 240 hour(s))  Urine culture     Status: Abnormal   Collection Time: 08/20/18  7:19 PM   Specimen: Urine, Random  Result Value Ref Range Status   Specimen Description   Final    URINE, RANDOM Performed at [facility], [address] [place], [place] [address]    Special Requests   Final    NONE Performed at [facility], [address] [place], [place] [address]    Culture 50,000 COLONIES/mL YEAST (A)  Final   Report Status 08/22/2018 FINAL  Final  SARS Coronavirus 2 (CEPHEID - Performed in [facility] lab), Hosp Order     Status: None   Collection Time: 08/20/18  7:19 PM   Specimen: Nasopharyngeal Swab  Result Value Ref Range Status   SARS Coronavirus 2 NEGATIVE NEGATIVE Final    Comment: (NOTE) If result is NEGATIVE SARS-CoV-2 target nucleic acids are NOT DETECTED. The SARS-CoV-2 RNA is generally detectable in upper and lower  respiratory specimens during the acute phase of infection. The lowest  concentration of SARS-CoV-2 viral copies this assay can detect is 250  copies / mL. A negative result does not preclude SARS-CoV-2 infection  and should not be used as the sole basis for treatment or other  patient management decisions.  A negative result may occur with  improper specimen collection / handling, submission of specimen other  than nasopharyngeal swab, presence of viral mutation(s) within the  areas targeted by this assay, and inadequate number of viral copies  (<250 copies / mL). A negative result must be combined with clinical  observations, patient history, and epidemiological information. If result is POSITIVE SARS-CoV-2 target nucleic acids are DETECTED. The SARS-CoV-2 RNA  is generally detectable in upper and lower  respiratory specimens dur ing the acute phase of infection.  Positive   results are indicative of active infection with SARS-CoV-2.  Clinical  correlation with patient history and other diagnostic information is  necessary to determine patient infection status.  Positive results do  not rule out bacterial infection or co-infection with other viruses. If result is PRESUMPTIVE POSTIVE SARS-CoV-2 nucleic acids MAY BE PRESENT.   A presumptive positive result was obtained on the submitted specimen  and confirmed on repeat testing.  While 2019 novel coronavirus  (SARS-CoV-2) nucleic acids may be present in the submitted sample  additional confirmatory testing may be necessary for epidemiological  and / or clinical management purposes  to differentiate between  SARS-CoV-2 and other Sarbecovirus currently known to infect humans.  If clinically indicated additional testing with an alternate test  methodology [phone]) is advised. The SARS-CoV-2 RNA is generally  detectable in upper and lower respiratory sp ecimens during the acute  phase of infection. The expected result is Negative. Fact Sheet for Patients:  BoilerBrush.com.cy Fact Sheet for Healthcare Providers: https://pope.com/ This test is not yet approved or cleared by the [place] FDA and has been authorized for detection and/or diagnosis of SARS-CoV-2 by FDA under an Emergency Use Authorization (EUA).  This EUA will remain in effect (meaning this test can be used) for the duration of the COVID-19 declaration under Section 564(b)(1) of the Act, 21 U.S.C. section 360bbb-3(b)(1), unless the authorization is terminated or revoked sooner. Performed at [facility], [address] [place], [place] [address]   Blood Culture (routine x 2)     Status: None (Preliminary result)   Collection Time: 08/20/18  7:20 PM   Specimen: BLOOD  Result Value Ref Range Status   Specimen Description BLOOD RIGHT ARM  Final   Special Requests   Final    BOTTLES  DRAWN AEROBIC AND ANAEROBIC Blood Culture results may not be optimal due to an excessive volume of blood received in culture bottles   Culture   Final    NO GROWTH 3 DAYS Performed at [facility], [address], [place], [place] [address]    Report Status PENDING  Incomplete  Blood Culture (routine x 2)     Status: None (Preliminary result)   Collection Time: 08/20/18  7:20 PM   Specimen: BLOOD  Result Value Ref Range Status   Specimen Description BLOOD LEFT WRIST  Final   Special Requests   Final    BOTTLES DRAWN AEROBIC AND ANAEROBIC Blood Culture adequate volume   Culture   Final    NO GROWTH 3 DAYS Performed at [facility], [address], [place], [place] [address]    Report Status PENDING  Incomplete  MRSA PCR Screening     Status: None   Collection Time: 08/22/18  8:49 PM   Specimen: Nasopharyngeal  Result Value Ref Range Status   MRSA by PCR NEGATIVE NEGATIVE Final    Comment:        The GeneXpert MRSA Assay (FDA approved for NASAL specimens only), is one component of a comprehensive MRSA colonization surveillance program. It is not intended to diagnose MRSA infection nor to guide or monitor treatment for MRSA infections. Performed at [facility], [address] [place], [place] [address]     Radiology Reports Dg Chest 1 View  Result Date: 08/23/2018 CLINICAL DATA:  85 year old male with history of community-acquired pneumonia. History of stroke. EXAM: CHEST  1 VIEW COMPARISON:  Chest x-ray 08/20/2018. FINDINGS: Lung volumes are low. Persistent opacity in the periphery of the left base obscuring the lateral aspect of the left hemidiaphragm, concerning for airspace consolidation. Right lung is clear. Possible small left pleural effusion. No right pleural effusion. No pneumothorax. No evidence of pulmonary edema. Heart size is mildly enlarged. Upper mediastinal contours are within normal limits. Aortic atherosclerosis. IMPRESSION: 1.  Persistent atelectasis and/or consolidation in the left lower lobe with probable small left pleural effusion. 2. Aortic atherosclerosis. 3. Mild cardiomegaly. Electronically Signed   By: Trudie Reedaniel  Entrikin M.D.   On: 08/23/2018 08:20   Dg Chest 1 View  Result Date: 07/28/2018 CLINICAL DATA:  Unwitnessed fall. EXAM: CHEST  1 VIEW COMPARISON:  Radiograph of June 08, 2017. FINDINGS: Stable cardiomegaly. Atherosclerosis of thoracic aorta is noted. No pneumothorax is noted. Right lung is clear. Possible mild left basilar atelectasis is noted with associated effusion. Bony thorax is unremarkable. IMPRESSION: Possible mild left basilar atelectasis is noted with small pleural effusion. Aortic Atherosclerosis (ICD10-I70.0). Electronically Signed   By: Lupita RaiderJames  Green Jr M.D.   On: 07/28/2018 16:19   Dg Pelvis 1-2 Views  Result Date: 07/28/2018 CLINICAL DATA:  Unwitnessed fall. EXAM: PELVIS - 1-2 VIEW COMPARISON:  None. FINDINGS: There is no evidence of pelvic fracture or diastasis. No pelvic bone lesions are seen. Suprapubic catheter is noted. IMPRESSION: No acute abnormality seen in the pelvis. Electronically Signed   By: Lupita RaiderJames  Green Jr M.D.   On: 07/28/2018 16:20   Ct Head Wo Contrast  Result Date: 07/28/2018 CLINICAL DATA:  Fall EXAM: CT HEAD WITHOUT CONTRAST CT MAXILLOFACIAL WITHOUT CONTRAST CT CERVICAL SPINE WITHOUT CONTRAST TECHNIQUE: Multidetector CT imaging of the head, cervical spine, and maxillofacial structures were performed using the standard protocol without intravenous contrast. Multiplanar CT image reconstructions of the cervical spine and maxillofacial structures were also generated. COMPARISON:  None. FINDINGS: CT HEAD FINDINGS Brain: There is no mass, hemorrhage or extra-axial collection. There is generalized atrophy without lobar predilection. There is hypoattenuation of the periventricular white matter, most commonly indicating chronic ischemic microangiopathy. Vascular: No abnormal hyperdensity  of the major intracranial arteries or dural venous sinuses. No intracranial atherosclerosis. Skull: Small left frontal scalp laceration.  No skull fracture. CT MAXILLOFACIAL FINDINGS Osseous: --Complex facial fracture types: No LeFort, zygomaticomaxillary complex or nasoorbitoethmoidal fracture. --Simple fracture types: None. --Mandible: No fracture or dislocation. Orbits: The globes are intact. Normal appearance of the intra- and extraconal fat. Symmetric extraocular muscles and optic nerves. Sinuses: No fluid levels or advanced mucosal thickening. Soft tissues: Small left periorbital soft tissue hematoma. CT CERVICAL SPINE FINDINGS Alignment: No static subluxation. Facets are aligned. Occipital condyles and the lateral masses of C1-C2 are aligned. Skull base and vertebrae: No acute fracture. Soft tissues and spinal canal: No prevertebral fluid or swelling. No visible canal hematoma. Disc levels: Multilevel facet hypertrophy greatest at C3-5. No bony spinal canal stenosis. Upper chest: No pneumothorax, pulmonary nodule or pleural effusion. Other: Normal visualized paraspinal cervical soft tissues. IMPRESSION: 1. No acute intracranial abnormality. 2. Small left frontal scalp laceration and left periorbital soft tissue hematoma. No facial fracture. 3. No acute fracture or static subluxation of the cervical spine. Electronically Signed   By: Deatra RobinsonKevin  Herman M.D.   On: 07/28/2018 16:14   Ct Cervical Spine Wo Contrast  Result Date: 07/28/2018 CLINICAL DATA:  Fall EXAM: CT HEAD WITHOUT CONTRAST CT MAXILLOFACIAL WITHOUT CONTRAST CT CERVICAL SPINE WITHOUT CONTRAST TECHNIQUE: Multidetector CT imaging of the head, cervical spine,  and maxillofacial structures were performed using the standard protocol without intravenous contrast. Multiplanar CT image reconstructions of the cervical spine and maxillofacial structures were also generated. COMPARISON:  None. FINDINGS: CT HEAD FINDINGS Brain: There is no mass, hemorrhage or  extra-axial collection. There is generalized atrophy without lobar predilection. There is hypoattenuation of the periventricular white matter, most commonly indicating chronic ischemic microangiopathy. Vascular: No abnormal hyperdensity of the major intracranial arteries or dural venous sinuses. No intracranial atherosclerosis. Skull: Small left frontal scalp laceration.  No skull fracture. CT MAXILLOFACIAL FINDINGS Osseous: --Complex facial fracture types: No LeFort, zygomaticomaxillary complex or nasoorbitoethmoidal fracture. --Simple fracture types: None. --Mandible: No fracture or dislocation. Orbits: The globes are intact. Normal appearance of the intra- and extraconal fat. Symmetric extraocular muscles and optic nerves. Sinuses: No fluid levels or advanced mucosal thickening. Soft tissues: Small left periorbital soft tissue hematoma. CT CERVICAL SPINE FINDINGS Alignment: No static subluxation. Facets are aligned. Occipital condyles and the lateral masses of C1-C2 are aligned. Skull base and vertebrae: No acute fracture. Soft tissues and spinal canal: No prevertebral fluid or swelling. No visible canal hematoma. Disc levels: Multilevel facet hypertrophy greatest at C3-5. No bony spinal canal stenosis. Upper chest: No pneumothorax, pulmonary nodule or pleural effusion. Other: Normal visualized paraspinal cervical soft tissues. IMPRESSION: 1. No acute intracranial abnormality. 2. Small left frontal scalp laceration and left periorbital soft tissue hematoma. No facial fracture. 3. No acute fracture or static subluxation of the cervical spine. Electronically Signed   By: Deatra Robinson M.D.   On: 07/28/2018 16:14   Dg Chest Port 1 View  Result Date: 08/20/2018 CLINICAL DATA:  Altered mental status EXAM: PORTABLE CHEST 1 VIEW COMPARISON:  07/28/2018, 06/08/2017 FINDINGS: Right lung is grossly clear. Enlarged cardiomediastinal silhouette. Suspected airspace disease at the lingula and left base, not significantly  changed. Aortic atherosclerosis. No pneumothorax. IMPRESSION: 1. Suspected airspace disease at the lingula and left base which may reflect atelectasis or pneumonia 2. Mild cardiomegaly Electronically Signed   By: Jasmine Pang M.D.   On: 08/20/2018 19:35   Ct Maxillofacial Wo Contrast  Result Date: 07/28/2018 CLINICAL DATA:  Fall EXAM: CT HEAD WITHOUT CONTRAST CT MAXILLOFACIAL WITHOUT CONTRAST CT CERVICAL SPINE WITHOUT CONTRAST TECHNIQUE: Multidetector CT imaging of the head, cervical spine, and maxillofacial structures were performed using the standard protocol without intravenous contrast. Multiplanar CT image reconstructions of the cervical spine and maxillofacial structures were also generated. COMPARISON:  None. FINDINGS: CT HEAD FINDINGS Brain: There is no mass, hemorrhage or extra-axial collection. There is generalized atrophy without lobar predilection. There is hypoattenuation of the periventricular white matter, most commonly indicating chronic ischemic microangiopathy. Vascular: No abnormal hyperdensity of the major intracranial arteries or dural venous sinuses. No intracranial atherosclerosis. Skull: Small left frontal scalp laceration.  No skull fracture. CT MAXILLOFACIAL FINDINGS Osseous: --Complex facial fracture types: No LeFort, zygomaticomaxillary complex or nasoorbitoethmoidal fracture. --Simple fracture types: None. --Mandible: No fracture or dislocation. Orbits: The globes are intact. Normal appearance of the intra- and extraconal fat. Symmetric extraocular muscles and optic nerves. Sinuses: No fluid levels or advanced mucosal thickening. Soft tissues: Small left periorbital soft tissue hematoma. CT CERVICAL SPINE FINDINGS Alignment: No static subluxation. Facets are aligned. Occipital condyles and the lateral masses of C1-C2 are aligned. Skull base and vertebrae: No acute fracture. Soft tissues and spinal canal: No prevertebral fluid or swelling. No visible canal hematoma. Disc levels:  Multilevel facet hypertrophy greatest at C3-5. No bony spinal canal stenosis. Upper chest: No pneumothorax, pulmonary nodule or  pleural effusion. Other: Normal visualized paraspinal cervical soft tissues. IMPRESSION: 1. No acute intracranial abnormality. 2. Small left frontal scalp laceration and left periorbital soft tissue hematoma. No facial fracture. 3. No acute fracture or static subluxation of the cervical spine. Electronically Signed   By: Ulyses Jarred M.D.   On: 07/28/2018 16:14     CBC Recent Labs  Lab 08/20/18 1919 08/21/18 0552 08/22/18 0542 08/23/18 0539  WBC 10.6* 11.2* 10.2 11.5*  HGB 11.6* 10.6* 10.4* 11.8*  HCT 37.6* 35.5* 34.8* 38.9*  PLT 197 171 165 181  MCV 92.2 92.9 94.1 93.7  MCH 28.4 27.7 28.1 28.4  MCHC 30.9 29.9* 29.9* 30.3  RDW 14.8 14.8 14.8 14.9  LYMPHSABS 5.5*  --   --   --   MONOABS 0.8  --   --   --   EOSABS 0.3  --   --   --   BASOSABS 0.1  --   --   --     Chemistries  Recent Labs  Lab 08/20/18 1919 08/21/18 0552 08/23/18 1023  NA 141 143 140  K 4.1 4.1 3.8  CL 108 112* 106  CO2 22 22 28   GLUCOSE 93 83 87  BUN 32* 28* 22  CREATININE 1.75* 1.62* 1.40*  CALCIUM 8.2* 7.8* 7.9*  MG 1.9  --  1.9  AST 21  --   --   ALT 12  --   --   ALKPHOS 60  --   --   BILITOT 0.4  --   --    ------------------------------------------------------------------------------------------------------------------ estimated creatinine clearance is 36.5 mL/min (A) (by C-G formula based on SCr of 1.4 mg/dL (H)). ------------------------------------------------------------------------------------------------------------------ No results for input(s): HGBA1C in the last 72 hours. ------------------------------------------------------------------------------------------------------------------ No results for input(s): CHOL, HDL, LDLCALC, TRIG, CHOLHDL, LDLDIRECT in the last 72  hours. ------------------------------------------------------------------------------------------------------------------ Recent Labs    08/23/18 1023  TSH 0.585   ------------------------------------------------------------------------------------------------------------------ No results for input(s): VITAMINB12, FOLATE, FERRITIN, TIBC, IRON, RETICCTPCT in the last 72 hours.  Coagulation profile Recent Labs  Lab 08/20/18 1919  INR 1.0    No results for input(s): DDIMER in the last 72 hours.  Cardiac Enzymes No results for input(s): CKMB, TROPONINI, MYOGLOBIN in the last 168 hours.  Invalid input(s): CK ------------------------------------------------------------------------------------------------------------------ Invalid input(s): POCBNP    Assessment & Plan   85 y.o. male with a known history of BPH, chronic kidney disease stage III, diabetes, GERD, hypertension, osteoporosis,, previous CVA, previous history of ESBL UTI who presents to the hospital due to weakness, altered mental status.  Acute metabolic encephalopathy secondary to dehydration and pneumonia.  Mental status has returned to baseline, per daughter. -Monitor  Acute hypoxic respiratory failure secondary to CAP- noted on chest x-ray on admission.   -Repeat chest x-ray this morning with persistent left lung base infiltrate -Procalcitonin negative, but given patient's leukocytosis, will treat with antibiotics for a total 5 days -Blood cultures with no growth -Wean O2 as able  Recent ESBL UTI-  resolved. -Repeat urine culture this admission only with >50,000 CFU yeast  BPH- patient has a chronic suprapubic catheter.  Patient is followed by Dr. Bernardo Heater.  He recently had a suprapubic catheter change. -Continue avodart.  Bradycardia with Mobitz type I AV block and intermittent 2: 1 AV block -Heparin was discontinued yesterday -Holding beta-blocker -TSH pending  Moderate to severe aortic  stenosis -Cardiology following- patient is likely not an appropriate candidate for valve replacement given age and comorbidities  CKD III- creatinine better than baseline -Monitor  Type 2 diabetes  -Continue SSI  GERD -Continue Protonix     Code Status Orders  (From admission, onward)         Start     Ordered   08/21/18 0242  Do not attempt resuscitation (DNR)  Continuous    Question Answer Comment  In the event of cardiac or respiratory ARREST Do not call a "code blue"   In the event of cardiac or respiratory ARREST Do not perform Intubation, CPR, defibrillation or ACLS   In the event of cardiac or respiratory ARREST Use medication by any route, position, wound care, and other measures to relive pain and suffering. May use oxygen, suction and manual treatment of airway obstruction as needed for comfort.   Comments Nurse to pronounce      08/21/18 0241        Code Status History    Date Active Date Inactive Code Status Order ID Comments User Context   06/08/2017 2119 06/12/2017 2144 DNR [id number]  SalaryEvelena Asa, Montell D, MD Inpatient   Advance Care Planning Activity    Advance Directive Documentation     Most Recent Value  Type of Advance Directive  Out of facility DNR (pink MOST or yellow form)  Pre-existing out of facility DNR order (yellow form or pink MOST form)  Physician notified to receive inpatient order  "MOST" Form in Place?  -     Consults cardiology   DVT Prophylaxis Lovenox  Lab Results  Component Value Date   PLT 181 08/23/2018     Time Spent in minutes 45min Greater than 50% of time spent in care coordination and counseling patient regarding the condition and plan of care.   Jinny BlossomKaty D Basil Buffin M.D on 08/23/2018 at 2:39 PM  Between 7am to 6pm - Pager [phone]- [phone]  After 6pm go to [email or web address] - Social research officer, governmentpassword [email or web address]  [employer]   Office  [phone]

## 2018-08-23 NOTE — TOC Initial Note (Signed)
Transition of Care [facility](TOC) - Initial/Assessment Note    Patient Details  Name: Dustin Hicks MRN: [id number] Date of Birth: 07/01/1928  Transition of Care [facility](TOC) CM/SW Contact:    Donnie CoffinErin M Erico Stan, LCSW Phone Number: 08/23/2018, 10:22 AM  Clinical Narrative:                  CSW spoke with patients daughter, Dustin Hicks, via phone to discuss discharge plans. Patient is a current resident at [facility] and plan if for him to return at time of discharge. Daughter voiced new frustration with facility due to new ownership Physiological scientist([facility]) and stated since new ownership patient has had more falls. Daughter states new ownership started around the time of the covid pandemic so she is unsure if patients falls are related to new ownership or related to her not being able to regularly see him. However, daughter would still prefer patient to return to facility at this time.   CSW encouraged daughter to reach out if she had any additional questions or concerns.  Plan: Patient to return to [facility]  Expected Discharge Plan: Skilled Nursing Facility Barriers to Discharge: No Barriers Identified   Patient Goals and CMS Choice Patient states their goals for this hospitalization and ongoing recovery are:: getting back to SNF on his regular schedule      Expected Discharge Plan and Services Expected Discharge Plan: Skilled Nursing Facility In-house Referral: Clinical Social Work Discharge Planning Services: CM Consult Post Acute Care Choice: Skilled Nursing Facility Living arrangements for the past 2 months: Single Family Home                                      Prior Living Arrangements/Services Living arrangements for the past 2 months: Single Family Home Lives with:: Facility Resident Patient language and need for interpreter reviewed:: Yes Do you feel safe going back to the place where you live?: Yes      Need for Family Participation in Patient Care: Yes  (Comment) Care giver support system in place?: Yes (comment)(daughter very involved)   Criminal Activity/Legal Involvement Pertinent to Current Situation/Hospitalization: No - Comment as needed  Activities of Daily Living Home Assistive Devices/Equipment: Wheelchair, Eyeglasses, Morgan StanleyHoyer Lift, Grab bars in shower, Grab bars around toilet ADL Screening (condition at time of admission) Patient's cognitive ability adequate to safely complete daily activities?: No Is the patient deaf or have difficulty hearing?: No Does the patient have difficulty seeing, even when wearing glasses/contacts?: No Does the patient have difficulty concentrating, remembering, or making decisions?: Yes Patient able to express need for assistance with ADLs?: No Does the patient have difficulty dressing or bathing?: Yes Independently performs ADLs?: No Communication: Independent Dressing (OT): Needs assistance Is this a change from baseline?: Pre-admission baseline Grooming: Needs assistance Is this a change from baseline?: Pre-admission baseline Feeding: Independent Bathing: Dependent Is this a change from baseline?: Pre-admission baseline Toileting: Dependent Is this a change from baseline?: Pre-admission baseline In/Out Bed: Dependent Is this a change from baseline?: Pre-admission baseline Walks in Home: Dependent Is this a change from baseline?: Pre-admission baseline Does the patient have difficulty walking or climbing stairs?: Yes Weakness of Legs: Both Weakness of Arms/Hands: Both  Permission Sought/Granted                  Emotional Assessment   Attitude/Demeanor/Rapport: Engaged(spoke with daughter) Affect (typically observed): Accepting, Calm, Appropriate, Pleasant Orientation: : (patient  is disoriented)   Psych Involvement: No (comment)  Admission diagnosis:  Bradycardia [R00.1] Acute respiratory failure with hypoxia ([place]) [J96.01] Community acquired pneumonia of left lower lobe of lung  ([place]) [J18.1] Patient Active Problem List   Diagnosis Date Noted  . Pneumonia 08/20/2018  . Urinary retention 11/03/2017  . Pressure injury of skin 06/09/2017  . Complicated UTI (urinary tract infection) 06/08/2017  . Chronic kidney disease 11/11/2016  . Lymphocytosis 11/11/2016  . Type 2 diabetes mellitus, with long-term current use of insulin ([place]) 11/11/2016   PCP:  Patient, No Pcp Per Pharmacy:  No Pharmacies Listed    Social Determinants of Health (SDOH) Interventions    Readmission Risk Interventions Readmission Risk Prevention Plan 08/23/2018  Transportation Screening Complete  HRI or [place] Not Complete  HRI or Home Care Consult comments patient from SNF  Social Work Consult for [place] Planning/Counseling Complete

## 2018-08-23 NOTE — Care Management Important Message (Signed)
Important Message  Patient Details  Name: Dustin Hicks MRN: [id number] Date of Birth: [birth year]   Medicare Important Message Given:  Yes     [name] 08/23/2018, 11:27 AM

## 2018-08-23 NOTE — Plan of Care (Signed)
  Problem: Education: Goal: Knowledge of General Education information will improve Description: Including pain rating scale, medication(s)/side effects and non-pharmacologic comfort measures Outcome: Not Progressing Note: Patient far too drowsy today to achieve this goal. Given Xanax overnight (about 1:30A). Will continue to monitor neurological status. Wenda Low [facility]

## 2018-08-24 ENCOUNTER — Encounter: Payer: Self-pay | Admitting: *Deleted

## 2018-08-24 LAB — BASIC METABOLIC PANEL
Anion gap: 8 (ref 5–15)
BUN: 23 mg/dL (ref 8–23)
CO2: 27 mmol/L (ref 22–32)
Calcium: 8.5 mg/dL — ABNORMAL LOW (ref 8.9–10.3)
Chloride: 105 mmol/L (ref 98–111)
Creatinine, Ser: 1.35 mg/dL — ABNORMAL HIGH (ref 0.61–1.24)
GFR calc Af Amer: 54 mL/min — ABNORMAL LOW (ref >60)
GFR calc non Af Amer: 47 mL/min — ABNORMAL LOW (ref >60)
Glucose, Bld: 94 mg/dL (ref 70–99)
Potassium: 4.4 mmol/L (ref 3.5–5.1)
Sodium: 140 mmol/L (ref 135–145)

## 2018-08-24 LAB — CBC
HCT: 38.4 % — ABNORMAL LOW (ref 39.0–52.0)
Hemoglobin: 11.6 g/dL — ABNORMAL LOW (ref 13.0–17.0)
MCH: 28.4 pg (ref 26.0–34.0)
MCHC: 30.2 g/dL (ref 30.0–36.0)
MCV: 94.1 fL (ref 80.0–100.0)
Platelets: 171 10*3/uL (ref 150–400)
RBC: 4.08 MIL/uL — ABNORMAL LOW (ref 4.22–5.81)
RDW: 14.8 % (ref 11.5–15.5)
WBC: 10.9 10*3/uL — ABNORMAL HIGH (ref 4.0–10.5)
nRBC: 0 % (ref 0.0–0.2)

## 2018-08-24 LAB — GLUCOSE, CAPILLARY
Glucose-Capillary: 81 mg/dL (ref 70–99)
Glucose-Capillary: 90 mg/dL (ref 70–99)

## 2018-08-24 MED ORDER — INSULIN GLARGINE 100 UNIT/ML ~~LOC~~ SOLN: 5.0000 [IU] | Freq: Every day | SUBCUTANEOUS | Status: AC

## 2018-08-24 NOTE — Plan of Care (Signed)
Pt very pleasant last night, slept well  Problem: Clinical Measurements: Goal: Respiratory complications will improve Outcome: Progressing Note: Weaned from 2L O2 to room air satting in the high 90's   Problem: Nutrition: Goal: Adequate nutrition will be maintained Outcome: Progressing   Problem: Coping: Goal: Level of anxiety will decrease Outcome: Progressing   Problem: Elimination: Goal: Will not experience complications related to urinary retention Outcome: Progressing   Problem: Pain Managment: Goal: General experience of comfort will improve Outcome: Progressing Note: No complaints of pain    Problem: Safety: Goal: Ability to remain free from injury will improve Outcome: Progressing Note: Low bed in place,

## 2018-08-24 NOTE — Progress Notes (Signed)
Patient given xanax on 08/23/18 at 0130 by this RN for increased anxiety and agitation AEB inability to sleep, attempting to get out of bed without assistance, calling out for his wife, and asking staff to lock up his house. Patient was difficult to redirect and would not cooperate with staff to ensure safety by placing patient in a low bed (policy). On call provider informed and new orders rec'd for xanax which was effective to decrease patient's agitation and anxiety. Endorsed to day shift Therapist, sports.

## 2018-08-24 NOTE — Progress Notes (Signed)
Report given to Dustin Hicks at [facility]. EMS called for transport. Daughter Olin Hauser also called for update and discharge instructions. IV taken out and tele monitor off.

## 2018-08-24 NOTE — TOC Transition Note (Signed)
Transition of Care [facility]) - CM/SW Discharge Note   Patient Details  Name: Dustin Hicks MRN: [id number] Date of Birth: [birth year]  Transition of Care [facility]) CM/SW Contact:  Ross Ludwig, LCSW Phone Number: 08/24/2018, 4:14 PM   Clinical Narrative:     Patient to be d/c'ed today to [facility] room E14.  Patient and family agreeable to plans will transport via ems RN to call report.  CSW notified patient's daughter that patient will be discharging today.     Final next level of care: Skilled Nursing Facility Barriers to Discharge: No Barriers Identified   Patient Goals and CMS Choice Patient states their goals for this hospitalization and ongoing recovery are:: To return back to SNF. CMS Medicare.gov Compare Post Acute Care list provided to:: Patient Represenative (must comment) Choice offered to / list presented to : Adult Children  Discharge Placement   Existing PASRR number confirmed : 08/20/18          Patient chooses bed at: [facility] Patient to be transferred to facility by: [facility] Name of family member notified: Patient's daughter [name] has been made aware. Patient and family notified of of transfer: 08/24/18  Discharge Plan and Services In-house Referral: Clinical Social Work Discharge Planning Services: CM Consult Post Acute Care Choice: [place]          DME Arranged: N/A DME Agency: NA     Representative spoke with at DME Agency: na            Social Determinants of Health ([place]) Interventions     Readmission Risk Interventions Readmission Risk Prevention Plan 08/23/2018  Transportation Screening Complete  HRI or [place] Not Complete  HRI or Home Care Consult comments patient from SNF  Social Work Consult for [place] Planning/Counseling Complete

## 2018-08-24 NOTE — Discharge Summary (Signed)
[employer] [facility] [place] [facility]   PATIENT NAME: Dustin Hicks    MR#:  [id number]  DATE OF BIRTH:  07/23/1928  DATE OF ADMISSION:  08/20/2018   ADMITTING PHYSICIAN: Dustin SirenVivek J Sainani, MD  DATE OF DISCHARGE: 08/24/18  PRIMARY CARE PHYSICIAN: Patient, No Pcp Per   ADMISSION DIAGNOSIS:  Bradycardia [R00.1] Acute respiratory failure with hypoxia (HCC) [J96.01] Community acquired pneumonia of left lower lobe of lung (HCC) [J18.1] DISCHARGE DIAGNOSIS:  Active Problems:   Pneumonia  SECONDARY DIAGNOSIS:   Past Medical History:  Diagnosis Date  . BPH (benign prostatic hyperplasia)   . Chronic kidney disease   . Constipation   . Depression   . Diabetes (HCC)   . Dysphagia   . Edema   . GERD (gastroesophageal reflux disease)   . Hypertension   . Hypokalemia   . Iron deficiency anemia   . Osteoporosis   . Pressure ulcer    right heel, stage 3 per  . Stroke (HCC)   . UTI (urinary tract infection)    HOSPITAL COURSE:   Dustin Borderrthur is an 85 year old male who presented to the ED with weakness and AMS. In the ED, CXR showed a pneumonia. He was admitted for further management.  Acute metabolicencephalopathy secondary to dehydration and pneumonia.  Mental status has returned to baseline.  CAP-noted on chest x-ray on admission.  -Patient completed a 5 day course of ceftriaxone and azithromycin while in the hospital  Recent ESBL UTI- resolved. -Repeat urine culture this admission only with >50,000 CFU yeast  BPH-patient has a chronic suprapubic catheter. Patient is followed by Dr. Lonna CobbStoioff. He recently had a suprapubic catheter change. -Continued avodart.  Bradycardia with Mobitz type I AV block and intermittent 2: 1 AV block -Seen by cardiology, who recommended holding her beta blocker -TSH normal -Monitor HR closely as an outpatient and consider restarting low dose beta blocker if HR is significantly elevated  Moderate to severe aortic stenosis  -Seen by cardiology, who did not feel that patient was an appropriate candidate for valve replacement given age and comorbidities  DISCHARGE CONDITIONS:  CAP BPH Bradycardia Moderate to severe aortic stenosis CONSULTS OBTAINED:  Treatment Team:  Dustin Hicks, Dustin A, MD DRUG ALLERGIES:  No Known Allergies DISCHARGE MEDICATIONS:   Allergies as of 08/24/2018   No Known Allergies     Medication List    STOP taking these medications   metoprolol succinate 25 MG 24 hr tablet Commonly known as: TOPROL-XL     TAKE these medications   acetaminophen 325 MG tablet Commonly known as: TYLENOL Take 650 mg by mouth every 4 (four) hours as needed for mild pain or fever. What changed: Another medication with the same name was removed. Continue taking this medication, and follow the directions you see here.   acetic acid 0.25 % irrigation Irrigate with 1 application as directed at bedtime.   aspirin 81 MG chewable tablet Chew 81 mg by mouth daily.   calcium carbonate 500 MG chewable tablet Commonly known as: TUMS - dosed in mg elemental calcium Chew 1 tablet by mouth daily.   Cholecalciferol 25 MCG (1000 UT) capsule Take 1,000 Units by mouth daily.   citalopram 20 MG tablet Commonly known as: CELEXA Take 20 mg by mouth daily.   Cough Drops Menthol Lozg Use as directed 1 lozenge in the mouth or throat 4 (four) times daily as needed (sore throat).   dutasteride 0.5 MG capsule Commonly known as: AVODART Take 0.5 mg  by mouth daily.   ferrous sulfate 220 (44 Fe) MG/5ML solution Take 330 mg by mouth daily.   furosemide 20 MG tablet Commonly known as: LASIX Take 20 mg by mouth daily.   guaiFENesin 100 MG/5ML Soln Commonly known as: ROBITUSSIN Take 10 mLs by mouth every 4 (four) hours as needed for cough or to loosen phlegm.   insulin glargine 100 UNIT/ML injection Commonly known as: LANTUS Inject 0.05 mLs (5 Units total) into the skin at bedtime.   pantoprazole 40 MG tablet  Commonly known as: PROTONIX Take 40 mg by mouth daily.   polyethylene glycol 17 g packet Commonly known as: MIRALAX / GLYCOLAX Take 17 g by mouth daily as needed for mild constipation.   potassium chloride 10 MEQ tablet Commonly known as: K-DUR Take 10 mEq by mouth daily.   QUEtiapine 25 MG tablet Commonly known as: SEROQUEL Take 12.5 mg by mouth at bedtime.   traMADol 50 MG tablet Commonly known as: ULTRAM Take 1 tablet (50 mg total) by mouth 3 (three) times daily as needed for moderate pain.   traZODone 50 MG tablet Commonly known as: DESYREL Take 50 mg by mouth every evening.   zinc oxide 20 % ointment Apply 1 application topically as needed for irritation. Xerosis cutis        DISCHARGE INSTRUCTIONS:  1. F/u with cardiology in 1 week 2. Hold metoprolol for now and monitor HR closely DIET:  Dysphagia II diet with nectar thick liquids, give meds crushed in puree DISCHARGE CONDITION:  Stable ACTIVITY:  Activity as tolerated OXYGEN:  Home Oxygen: No.  Oxygen Delivery: room air DISCHARGE LOCATION:  ALF   If you experience worsening of your admission symptoms, develop shortness of breath, life threatening emergency, suicidal or homicidal thoughts you must seek medical attention immediately by calling 911 or calling your MD immediately  if symptoms less severe.  You Must read complete instructions/literature along with all the possible adverse reactions/side effects for all the Medicines you take and that have been prescribed to you. Take any new Medicines after you have completely understood and accpet all the possible adverse reactions/side effects.   Please note  You were cared for by a hospitalist during your hospital stay. If you have any questions about your discharge medications or the care you received while you were in the hospital after you are discharged, you can call the unit and asked to speak with the hospitalist on call if the hospitalist that took care  of you is not available. Once you are discharged, your primary care physician will handle any further medical issues. Please note that NO REFILLS for any discharge medications will be authorized once you are discharged, as it is imperative that you return to your primary care physician (or establish a relationship with a primary care physician if you do not have one) for your aftercare needs so that they can reassess your need for medications and monitor your lab values.    On the day of Discharge:  VITAL SIGNS:  Blood pressure 127/62, pulse (!) 118, temperature 97.8 F (36.6 C), temperature source Oral, resp. rate 16, height 5\' 9"  (1.753 m), weight 78.1 kg, SpO2 95 %. PHYSICAL EXAMINATION:  GENERAL:  85 y.o.-year-old patient lying in the bed with no acute distress.  EYES: Pupils equal, round, reactive to light and accommodation. No scleral icterus. Extraocular muscles intact.  HEENT: Head atraumatic, normocephalic. Oropharynx and nasopharynx clear.  NECK:  Supple, no jugular venous distention. No thyroid enlargement, no  tenderness.  LUNGS: Normal breath sounds bilaterally, no wheezing, rales,rhonchi or crepitation. No use of accessory muscles of respiration.  CARDIOVASCULAR: RRR, S1, S2 normal. No murmurs, rubs, or gallops.  ABDOMEN: Soft, non-tender, non-distended. Bowel sounds present. No organomegaly or mass.  EXTREMITIES: No pedal edema, cyanosis, or clubbing.  NEUROLOGIC: Cranial nerves II through XII are intact. +global weakness. Sensation intact. Gait not checked.  PSYCHIATRIC: The patient is alert and oriented only to person.  SKIN: No obvious rash, lesion, or ulcer.  DATA REVIEW:   CBC Recent Labs  Lab 08/24/18 0641  WBC 10.9*  HGB 11.6*  HCT 38.4*  PLT 171    Chemistries  Recent Labs  Lab 08/20/18 1919  08/23/18 1023 08/24/18 0641  NA 141   < > 140 140  K 4.1   < > 3.8 4.4  CL 108   < > 106 105  CO2 22   < > 28 27  GLUCOSE 93   < > 87 94  BUN 32*   < > 22 23   CREATININE 1.75*   < > 1.40* 1.35*  CALCIUM 8.2*   < > 7.9* 8.5*  MG 1.9  --  1.9  --   AST 21  --   --   --   ALT 12  --   --   --   ALKPHOS 60  --   --   --   BILITOT 0.4  --   --   --    < > = values in this interval not displayed.     Microbiology Results  Results for orders placed or performed during the hospital encounter of 08/20/18  Urine culture     Status: Abnormal   Collection Time: 08/20/18  7:19 PM   Specimen: Urine, Random  Result Value Ref Range Status   Specimen Description   Final    URINE, RANDOM Performed at [facility], [address] [place], [place] [address]    Special Requests   Final    NONE Performed at [facility], [address] [place], [place] [address]    Culture 50,000 COLONIES/mL YEAST (A)  Final   Report Status 08/22/2018 FINAL  Final  SARS Coronavirus 2 (CEPHEID - Performed in [facility] hospital lab), Hosp Order     Status: None   Collection Time: 08/20/18  7:19 PM   Specimen: Nasopharyngeal Swab  Result Value Ref Range Status   SARS Coronavirus 2 NEGATIVE NEGATIVE Final    Comment: (NOTE) If result is NEGATIVE SARS-CoV-2 target nucleic acids are NOT DETECTED. The SARS-CoV-2 RNA is generally detectable in upper and lower  respiratory specimens during the acute phase of infection. The lowest  concentration of SARS-CoV-2 viral copies this assay can detect is 250  copies / mL. A negative result does not preclude SARS-CoV-2 infection  and should not be used as the sole basis for treatment or other  patient management decisions.  A negative result may occur with  improper specimen collection / handling, submission of specimen other  than nasopharyngeal swab, presence of viral mutation(s) within the  areas targeted by this assay, and inadequate number of viral copies  (<250 copies / mL). A negative result must be combined with clinical  observations, patient history, and epidemiological information. If result is  POSITIVE SARS-CoV-2 target nucleic acids are DETECTED. The SARS-CoV-2 RNA is generally detectable in upper and lower  respiratory specimens dur ing the acute phase of infection.  Positive  results  are indicative of active infection with SARS-CoV-2.  Clinical  correlation with patient history and other diagnostic information is  necessary to determine patient infection status.  Positive results do  not rule out bacterial infection or co-infection with other viruses. If result is PRESUMPTIVE POSTIVE SARS-CoV-2 nucleic acids MAY BE PRESENT.   A presumptive positive result was obtained on the submitted specimen  and confirmed on repeat testing.  While 2019 novel coronavirus  (SARS-CoV-2) nucleic acids may be present in the submitted sample  additional confirmatory testing may be necessary for epidemiological  and / or clinical management purposes  to differentiate between  SARS-CoV-2 and other Sarbecovirus currently known to infect humans.  If clinically indicated additional testing with an alternate test  methodology [phone]([id number]) is advised. The SARS-CoV-2 RNA is generally  detectable in upper and lower respiratory sp ecimens during the acute  phase of infection. The expected result is Negative. Fact Sheet for Patients:  BoilerBrush.com.cyhttps://www.fda.gov/media/136312/download Fact Sheet for Healthcare Providers: https://pope.com/https://www.fda.gov/media/136313/download This test is not yet approved or cleared by the [place] FDA and has been authorized for detection and/or diagnosis of SARS-CoV-2 by FDA under an Emergency Use Authorization (EUA).  This EUA will remain in effect (meaning this test can be used) for the duration of the COVID-19 declaration under Section 564(b)(1) of the Act, 21 U.S.C. section 360bbb-3(b)(1), unless the authorization is terminated or revoked sooner. Performed at [facility], [address] [place], [place] [address]   Blood Culture (routine x 2)     Status: None  (Preliminary result)   Collection Time: 08/20/18  7:20 PM   Specimen: BLOOD  Result Value Ref Range Status   Specimen Description BLOOD RIGHT ARM  Final   Special Requests   Final    BOTTLES DRAWN AEROBIC AND ANAEROBIC Blood Culture results may not be optimal due to an excessive volume of blood received in culture bottles   Culture   Final    NO GROWTH 4 DAYS Performed at [facility], [address] [place], [place] [address]    Report Status PENDING  Incomplete  Blood Culture (routine x 2)     Status: None (Preliminary result)   Collection Time: 08/20/18  7:20 PM   Specimen: BLOOD  Result Value Ref Range Status   Specimen Description BLOOD LEFT WRIST  Final   Special Requests   Final    BOTTLES DRAWN AEROBIC AND ANAEROBIC Blood Culture adequate volume   Culture   Final    NO GROWTH 4 DAYS Performed at [facility], [address] [place], [place] [address]    Report Status PENDING  Incomplete  MRSA PCR Screening     Status: None   Collection Time: 08/22/18  8:49 PM   Specimen: Nasopharyngeal  Result Value Ref Range Status   MRSA by PCR NEGATIVE NEGATIVE Final    Comment:        The GeneXpert MRSA Assay (FDA approved for NASAL specimens only), is one component of a comprehensive MRSA colonization surveillance program. It is not intended to diagnose MRSA infection nor to guide or monitor treatment for MRSA infections. Performed at [facility], [address] [place], [place] [address]     RADIOLOGY:  No results found.   Management plans discussed with the patient, family and they are in agreement.  CODE STATUS: DNR   TOTAL TIME TAKING CARE OF THIS PATIENT: 45 minutes.    Jinny BlossomKaty D Mayo M.D on 08/24/2018 at 12:08 PM  Between  7am to 6pm - Pager - [phone]  After 6pm go to [email or web address] - Social research officer, government  [facility]  Office  [phone]  CC: Primary care physician; Patient, No Pcp Per    Note: This dictation was prepared with Dragon dictation along with smaller phrase technology. Any transcriptional errors that result from this process are unintentional.

## 2018-08-25 LAB — CULTURE, BLOOD (ROUTINE X 2)
Culture: NO GROWTH
Culture: NO GROWTH
Special Requests: ADEQUATE

## 2018-09-04 NOTE — Progress Notes (Signed)
Virtual Visit via Telephone Note   This visit type was conducted due to national recommendations for restrictions regarding the COVID-19 Pandemic (e.g. social distancing) in an effort to limit this patient's exposure and mitigate transmission in our community.  Due to his co-morbid illnesses, this patient is at least at moderate risk for complications without adequate follow up.  This format is felt to be most appropriate for this patient at this time.  The patient did not have access to video technology/had technical difficulties with video requiring transitioning to audio format only (telephone).  All issues noted in this document were discussed and addressed.  No physical exam could be performed with this format.  Please refer to the patient's chart for his  consent to telehealth for [facility].    Date:  09/09/2018   ID:  Dustin GlazierArthur Hicks, DOB 09/08/1928, MRN [id number]  Patient Location: Skilled Nursing Facility Provider Location: Office  PCP:  Patient, No Pcp Per  Cardiologist:  Lorine BearsMuhammad Arida, MD  Electrophysiologist:  None   Evaluation Performed:  Follow-Up Visit  Chief Complaint:  Hospital follow up    History of Present Illness:    Dustin Hicks is a 85 y.o. male with history of recently diagnosed atrial flutter not on OAC given comorbid conditions as below, moderate to severe aortic stenosis, Mobitz type I, CVA, CKD stage III, anemia, large granular lymphocytosis, DM2, HTN, ESBL UTI, BPH, osteoporosis, and GERD after his admission to [facility] from 7/3 through 7/7 for metabolic encephalopathy secondary to dehydration and pneumonia with newly diagnosed atrial flutter with slow ventricular response as well as Mobitz type I AV block and intermittent 2:1 AV block.  Prior to the above hospitalization the patient did not have any previously known cardiac history.  He was seen in the ED on 07/28/2018 with a mechanical fall with EKG showing Mobitz type I AV block with heart rate of 49 bpm.   Potassium 4.8 with serum creatinine noted to be elevated from baseline and CBC showing a persistent leukocytosis.  Head CT was nonacute.  He was readmitted to the hospital in 7/3 with increased weakness and altered mental status in the setting of pneumonia.  He was bradycardic and hypotensive in the ED which improved with IV fluids.  Initially, he was noted to be in sinus bradycardia with first-degree AV block however he subsequently developed atrial flutter with a slow ventricular response with variable AV block.  He was given IV diltiazem and oral metoprolol with worsening bradycardia and slow ventricular rate.  Telemetry showed Mobitz type I as well as intermittent 2:1 AV block with the longest pause being 3 seconds.  Echo on 08/21/2018 showed an EF of 55 to 60%, LVH with severe basal septal hypertrophy, normal RV systolic function, normal RV cavity size, mildly dilated left atrium, severe mitral annular calcification, moderate to severe aortic stenosis with a possible low-flow low gradient severe aortic stenosis given valve area below 0.8 cm, grossly normal tricuspid valve, moderate mitral regurgitation.  Given the patient's comorbid conditions including advanced age and altered mental state it was felt he was not a good candidate for anticoagulation and even if his bradycardia worsened it was not entirely certain he would be a good candidate for PPM.  Nor was it felt he would be a good candidate for aortic valve replacement.  Spoke with Amil AmenJulia, nursing supervisor for [facility] who is taking care of the patient.  Patient was unable to dissipate in today's visit secondary to underlying dementia/mental  status.  Care provider indicates that since he has been discharged back to their facility has done quite well.  He has a stable blood pressures typically running in the 008Q systolic with most heart rates in the upper 50s to 60s bpm.  She does indicate he had 1 reading of 112 bpm 1 week prior  though none since.  Maximum blood pressure was in the 761 systolic.  She indicates his appetite is well.  He is pleased that he is back in his old room with his old roommate.  He does continue to fall as he does not listen to staff.  He has not complained of any chest pain, shortness of breath, palpitations, dizziness, presyncope, or syncope.  Nursing coordinator feels the patient is doing well and does not have any issues or concerns at this time.  Labs: 08/2018 -potassium 4.4, serum creatinine 1.35, WBC 10.9, Hgb 11.6, PLT 171, magnesium 1.9, TSH normal, albumin 3.2, AST/ALT normal    The patient does not have symptoms concerning for COVID-19 infection (fever, chills, cough, or new shortness of breath).    Past Medical History:  Diagnosis Date   BPH (benign prostatic hyperplasia)    Chronic kidney disease    Constipation    Depression    Diabetes (HCC)    Dysphagia    Edema    GERD (gastroesophageal reflux disease)    Hypertension    Hypokalemia    Iron deficiency anemia    Osteoporosis    Pressure ulcer    right heel, stage 3 per   Stroke [facility])    UTI (urinary tract infection)    Past Surgical History:  Procedure Laterality Date   IR CATHETER TUBE CHANGE  06/11/2018   IR CATHETER TUBE CHANGE  07/09/2018   SUPRAPUBIC CATHETER INSERTION       Current Meds  Medication Sig   acetaminophen (TYLENOL) 325 MG tablet Take 650 mg by mouth every 4 (four) hours as needed for mild pain or fever.   acetic acid 0.25 % irrigation Irrigate with 1 application as directed at bedtime.   aspirin 81 MG chewable tablet Chew 81 mg by mouth daily.   calcium carbonate (TUMS - DOSED IN MG ELEMENTAL CALCIUM) 500 MG chewable tablet Chew 1 tablet by mouth daily.   Cholecalciferol 25 MCG (1000 UT) capsule Take 1,000 Units by mouth daily.   citalopram (CELEXA) 20 MG tablet Take 20 mg by mouth daily.   dutasteride (AVODART) 0.5 MG capsule Take 0.5 mg by mouth daily.   ferrous  sulfate 220 (44 Fe) MG/5ML solution Take 330 mg by mouth daily.   furosemide (LASIX) 20 MG tablet Take 20 mg by mouth daily.    guaiFENesin (ROBITUSSIN) 100 MG/5ML SOLN Take 10 mLs by mouth every 4 (four) hours as needed for cough or to loosen phlegm.   insulin glargine (LANTUS) 100 UNIT/ML injection Inject 0.05 mLs (5 Units total) into the skin at bedtime.   pantoprazole (PROTONIX) 40 MG tablet Take 40 mg by mouth daily.    polyethylene glycol (MIRALAX / GLYCOLAX) packet Take 17 g by mouth daily as needed for mild constipation.   potassium chloride (K-DUR) 10 MEQ tablet Take 10 mEq by mouth daily.   QUEtiapine (SEROQUEL) 25 MG tablet Take 12.5 mg by mouth at bedtime.    Throat Lozenges (COUGH DROPS MENTHOL) LOZG Use as directed 1 lozenge in the mouth or throat 4 (four) times daily as needed (sore throat).   traMADol (ULTRAM) 50 MG tablet Take  1 tablet (50 mg total) by mouth 3 (three) times daily as needed for moderate pain.   traZODone (DESYREL) 50 MG tablet Take 50 mg by mouth every evening.   zinc oxide 20 % ointment Apply 1 application topically as needed for irritation. Xerosis cutis     Allergies:   Patient has no known allergies.   Social History   Tobacco Use   Smoking status: Never Smoker   Smokeless tobacco: Never Used  Substance Use Topics   Alcohol use: Never    Frequency: Never   Drug use: Never     Family Hx: The patient's family history includes Muscular dystrophy in his daughter.  ROS:   Please see the history of present illness.     All other systems reviewed and are negative.   Prior CV studies:   The following studies were reviewed today:  2D Echo 08/21/2018: 1. The left ventricle has normal systolic function, with an ejection fraction of 55-60%. The cavity size was normal. There is moderate concentric left ventricular hypertrophy with severe basal septal hypertrophy.  2. The right ventricle has normal systolic function. The cavity was normal.  There is no increase in right ventricular wall thickness. Right ventricular systolic pressure could not be assessed.  3. Left atrial size was mildly dilated.  4. Severe mitral annular dilatation.  5. The aortic valve is abnormal. Moderate thickening of the aortic valve. Severe calcifcation of the aortic valve. Moderate-severe stenosis of the aortic valve. Possible low flow low gradient severe stenosis given valve area below 0.8 .  6. The tricuspid valve is grossly normal.  7. The mitral valve is abnormal. Mitral valve regurgitation is moderate by color flow Doppler.  8. The inferior vena cava was dilated in size with >50% respiratory variability.  Labs/Other Tests and Data Reviewed:    EKG:  No ECG reviewed.  Recent Labs: 08/20/2018: ALT 12 08/23/2018: Magnesium 1.9; TSH 0.585 08/24/2018: BUN 23; Creatinine, Ser 1.35; Hemoglobin 11.6; Platelets 171; Potassium 4.4; Sodium 140   Recent Lipid Panel No results found for: CHOL, TRIG, HDL, CHOLHDL, LDLCALC, LDLDIRECT  Wt Readings from Last 3 Encounters:  09/09/18 174 lb (78.9 kg)  08/21/18 172 lb 1.6 oz (78.1 kg)  07/28/18 177 lb (80.3 kg)     Objective:    Vital Signs:  BP 102/72 Comment: Taken on 09/05/2018   Pulse (!) 48    Temp 98.2 F (36.8 C)    Ht 5\' 8"  (1.727 m)    Wt 174 lb (78.9 kg) Comment: taken 09/08/2018   SpO2 95%    BMI 26.46 kg/m    VITAL SIGNS:  reviewed  ASSESSMENT & PLAN:    1. Atrial flutter: Incidentally noted during most recent hospitalization with noted slow ventricular response.  Not currently on rate limiting medication given underlying bradycardia.  Not a candidate for long-term full dose oral anticoagulation secondary to mental status and frequent falls.  2. Sinus bradycardia with Mobitz type I AV block with intermittent 2:1 AV block: Asymptomatic.  At baseline per nursing staff.  Unlikely to be a candidate for pacer even if he were to develop high-grade AV block secondary to underlying dementia/altered mental  status/frequent falls/and advanced age.  Continue to avoid AV nodal blocking agents.  3. Moderate to severe aortic stenosis with moderate mitral regurgitation: Appears relatively asymptomatic/stable.  Unlikely to be a candidate for invasive intervention given his significant comorbid conditions as outlined above.  4. CKD stage III: Followed by nursing home staff.  5.  Normocytic anemia: Denies any symptoms concerning for bleeding.  Followed by nursing home staff.  6. Dementia: Per nursing staff at baseline.  COVID-19 Education: The signs and symptoms of COVID-19 were discussed with the patient and how to seek care for testing (follow up with PCP or arrange E-visit).  The importance of social distancing was discussed today.  Time:   Today, I have spent 15 minutes with the patient with telehealth technology discussing the above problems.     Medication Adjustments/Labs and Tests Ordered: Current medicines are reviewed at length with the patient today.  Concerns regarding medicines are outlined above.   Tests Ordered: No orders of the defined types were placed in this encounter.   Medication Changes: No orders of the defined types were placed in this encounter.   Follow Up:  In Person in 3 month(s)  Signed, Eula ListenRyan Demiyah Fischbach, PA-C  09/09/2018 10:23 AM    [facility]

## 2018-09-09 ENCOUNTER — Telehealth (INDEPENDENT_AMBULATORY_CARE_PROVIDER_SITE_OTHER): Payer: Medicare Other | Admitting: Physician Assistant

## 2018-09-09 ENCOUNTER — Encounter: Payer: Self-pay | Admitting: Physician Assistant

## 2018-09-09 ENCOUNTER — Other Ambulatory Visit: Payer: Self-pay

## 2018-09-09 VITALS — BP 102/72 | HR 48 | Temp 98.2°F | Ht 68.0 in | Wt 174.0 lb

## 2018-09-09 DIAGNOSIS — R001 Bradycardia, unspecified: Secondary | ICD-10-CM | POA: Diagnosis not present

## 2018-09-09 DIAGNOSIS — I350 Nonrheumatic aortic (valve) stenosis: Secondary | ICD-10-CM | POA: Diagnosis not present

## 2018-09-09 DIAGNOSIS — D649 Anemia, unspecified: Secondary | ICD-10-CM

## 2018-09-09 DIAGNOSIS — I441 Atrioventricular block, second degree: Secondary | ICD-10-CM

## 2018-09-09 DIAGNOSIS — N183 Chronic kidney disease, stage 3 unspecified: Secondary | ICD-10-CM

## 2018-09-09 DIAGNOSIS — I4892 Unspecified atrial flutter: Secondary | ICD-10-CM

## 2018-09-09 NOTE — Patient Instructions (Signed)
Medication Instructions:  Your physician recommends that you continue on your current medications as directed. Please refer to the Current Medication list given to you today.  If you need a refill on your cardiac medications before your next appointment, please call your pharmacy.   Lab work: None ordered If you have labs (blood work) drawn today and your tests are completely normal, you will receive your results only by: Marland Kitchen MyChart Message (if you have MyChart) OR . A paper copy in the mail If you have any lab test that is abnormal or we need to change your treatment, we will call you to review the results.  Testing/Procedures: None ordered  Follow-Up: At [facility], you and your health needs are our priority.  As part of our continuing mission to provide you with exceptional heart care, we have created designated Provider Care Teams.  These Care Teams include your primary Cardiologist (physician) and Advanced Practice Providers (APPs -  Physician Assistants and Nurse Practitioners) who all work together to provide you with the care you need, when you need it. You will need a follow up appointment in 3 months with Dr.Arida only.  Please call our office 2 months in advance to schedule this appointment.

## 2018-09-23 ENCOUNTER — Non-Acute Institutional Stay: Payer: Medicare Other | Admitting: Primary Care

## 2018-09-23 ENCOUNTER — Other Ambulatory Visit: Payer: Self-pay

## 2018-09-23 DIAGNOSIS — Z515 Encounter for palliative care: Secondary | ICD-10-CM

## 2018-09-23 NOTE — Progress Notes (Signed)
The[facility], [facility] Consult Note Telephone: [phone]) [phone]  Fax: [phone]  TELEHEALTH VISIT STATEMENT Due to the COVID-19 crisis, this visit was done via telemedicine from my office. It was initiated and consented to by this patient and/or family.  PATIENT NAME: Dustin Glazierrthur Hicks DOB: 12/14/1928 MRN: [id number]  PRIMARY CARE PROVIDER: Drue FlirtHenry-Tavon Corriher, Carol, MD [address] [place] [id number]   REFERRING PROVIDER:  Drue FlirtHenry-Anaih Brander, Carol, MD [address] [place] [id number]  RESPONSIBLE PARTY:   Extended Emergency Contact Information Primary Emergency Contact: Allred,Pamela Home Phone: [phone] Work Phone: [phone] Mobile Phone: [phone] Relation: Daughter  Palliative Care was asked to follow this patient by consultation request of Drue FlirtHenry-Roshan Roback, Carol, MD This is the initial visit.  ASSESSMENT AND RECOMMENDATIONS:   1. Goals of Care: Maximize quality of life and symptom management. I will reach out to POA to discuss goals of care in light of reported decline.  2. Symptom Management:   Nutrition: Is losing weight although staff reports good intake. May benefit from some nutritional supplements.   Constipation: Staff denies although reported small bm. Could add senna daily if bowel movements are not large and frequent.  3. Family /Caregiver/Community Supports: Lives in SNF, daughter is in area and works night shifts. I will reach out to her tomorrow.  4. Cognitive / Functional decline: Losing some weight and less interactive with staff.   5. Advance Care Directive:  DNR reported by SNF, I will discuss with daughter and possible create a MOST form is she would like.   6. Follow up Palliative Care Visit: Palliative care will continue to follow for goals of care clarification and symptom management. Return 2-4 weeks or prn.  I spent 25 minutes providing this consultation,  from 1000 to 1025. More than 50% of the time in  this consultation was spent coordinating communication.   HISTORY OF PRESENT ILLNESS:  Dustin Hicks is a 85 y.o. year old male with multiple medical problems including CKD, BPH, Depression, constipation, CVA, DM, h/o wounds, urine retention. Palliative Care was asked to help address goals of care.   CODE STATUS: DNR  PPS: 30% HOSPICE ELIGIBILITY/DIAGNOSIS: TBD  PAST MEDICAL HISTORY:  Past Medical History:  Diagnosis Date  . BPH (benign prostatic hyperplasia)   . Chronic kidney disease   . Constipation   . Depression   . Diabetes (HCC)   . Dysphagia   . Edema   . GERD (gastroesophageal reflux disease)   . Hypertension   . Hypokalemia   . Iron deficiency anemia   . Osteoporosis   . Pressure ulcer    right heel, stage 3 per  . Stroke (HCC)   . UTI (urinary tract infection)     SOCIAL HX:  Social History   Tobacco Use  . Smoking status: Never Smoker  . Smokeless tobacco: Never Used  Substance Use Topics  . Alcohol use: Never    Frequency: Never    ALLERGIES: No Known Allergies   PERTINENT MEDICATIONS:  Outpatient Encounter Medications as of 09/23/2018  Medication Sig  . acetaminophen (TYLENOL) 325 MG tablet Take 650 mg by mouth every 4 (four) hours as needed for mild pain or fever.  Marland Kitchen. acetic acid 0.25 % irrigation Irrigate with 1 application as directed at bedtime.  Marland Kitchen. aspirin 81 MG chewable tablet Chew 81 mg by mouth daily.  . calcium carbonate (TUMS - DOSED IN MG ELEMENTAL CALCIUM) 500 MG chewable tablet Chew 1 tablet by mouth daily.  .Marland Kitchen  Cholecalciferol 25 MCG (1000 UT) capsule Take 1,000 Units by mouth daily.  . citalopram (CELEXA) 20 MG tablet Take 20 mg by mouth daily.  Marland Kitchen dutasteride (AVODART) 0.5 MG capsule Take 0.5 mg by mouth daily.  . ferrous sulfate 220 (44 Fe) MG/5ML solution Take 330 mg by mouth daily.  . furosemide (LASIX) 20 MG tablet Take 20 mg by mouth daily.   Marland Kitchen guaiFENesin (ROBITUSSIN) 100 MG/5ML SOLN Take 10 mLs by mouth every 4 (four) hours as needed  for cough or to loosen phlegm.  . insulin glargine (LANTUS) 100 UNIT/ML injection Inject 0.05 mLs (5 Units total) into the skin at bedtime.  . pantoprazole (PROTONIX) 40 MG tablet Take 40 mg by mouth daily.   . polyethylene glycol (MIRALAX / GLYCOLAX) packet Take 17 g by mouth daily as needed for mild constipation.  . potassium chloride (K-DUR) 10 MEQ tablet Take 10 mEq by mouth daily.  . QUEtiapine (SEROQUEL) 25 MG tablet Take 12.5 mg by mouth at bedtime.   . Throat Lozenges (COUGH DROPS MENTHOL) LOZG Use as directed 1 lozenge in the mouth or throat 4 (four) times daily as needed (sore throat).  . traMADol (ULTRAM) 50 MG tablet Take 1 tablet (50 mg total) by mouth 3 (three) times daily as needed for moderate pain.  . traZODone (DESYREL) 50 MG tablet Take 50 mg by mouth every evening.  . zinc oxide 20 % ointment Apply 1 application topically as needed for irritation. Xerosis cutis   No facility-administered encounter medications on file as of 09/23/2018.     PHYSICAL EXAM/ROS:   Current and past weights: 170 lb August, 2020, 186  Lb in May 2020 General: NAD, frail Cardiovascular: no chest pain reported, h/o heart block and a fib but not Rx with OACs due to falls,  no edema reported Pulmonary: no cough, no increased SOB reported Abdomen: appetite good, 75-100% but losing wt, endorses occ. constipation,  BG ranges from 94-188. GU: denies dysuria,suprapubic cath for chronic retention. MSK:  W/c bound, frequent falls Skin: heel scabbed areas, uses heel boots Neurological: Weakness, but otherwise nonfocal  [place], AGPCNP-BC

## 2018-09-24 ENCOUNTER — Telehealth: Payer: Self-pay | Admitting: Primary Care

## 2018-09-24 NOTE — Telephone Encounter (Signed)
T/c to daughter Dustin Hicks to discuss case. We talked about MOST form and choices, and limitations of Covid. She is a RT and knows the covid risks, but it is hard not seeing her dad. We discussed his heart condition and her choices of care levels. She states he has a DNR and she would not want him on a ventilator but she would want the treatable treated.  I will upload DNR and continue to discuss MOST form creation. Revisit 2-4 weeks at [facility].

## 2018-09-30 ENCOUNTER — Non-Acute Institutional Stay: Payer: Medicare Other | Admitting: Primary Care

## 2018-09-30 ENCOUNTER — Other Ambulatory Visit: Payer: Self-pay

## 2018-09-30 DIAGNOSIS — Z515 Encounter for palliative care: Secondary | ICD-10-CM

## 2018-09-30 NOTE — Progress Notes (Signed)
[facility] Consult Note Telephone: [phone]  Fax: [phone]  PATIENT NAME: Dustin Hicks Strahle DOB: 09/30/1928 MRN: [id number]  PRIMARY CARE PROVIDER:   Drue FlirtHenry-Yailine Ballard, Carol, MD, [address] [place] [address] [phone]  REFERRING PROVIDER:  Drue FlirtHenry-Roxane Puerto, Carol, MD [address] [place] [address] [phone]  RESPONSIBLE PARTY:   Extended Emergency Contact Information Primary Emergency Contact: Allred,Pamela Home Phone: [phone] Work Phone: [phone] Mobile Phone: [phone] Relation: Daughter   ASSESSMENT AND RECOMMENDATIONS:   1. Advance Care Planning/Goals of Care: Goals include to maximize quality of life and symptom management. Has DNR, left message to discuss MOST with daughter who is POA.   2. Symptom Management:   Constipation: Recommend Senna 1 tab bid for ongoing constipation. Has not had a bm for 2-3 days as a usual pattern. No scheduled bowel program on MAR.  Intake and Appetite: Recommend weekly weights. Recommend Med pass supplements for weight loss. Has lost 15 lbs since mid May 2020. Eating 80% of meals per staff report but reports hunger and thirst. Must be assisted with all intake. Recently had liquids upgraded to thin liquids, which he likes.   3. Family /Caregiver/Community Supports: Daughter in area, works night shift. Resident of SNF.  4. Cognitive / Functional decline: Alert, oriented x 1. Dependent in all adls and I-adls. Pulls off top when in bed. This is an ongoing behavior.  5. Follow up Palliative Care Visit: Palliative care will continue to follow for goals of care clarification and symptom management. Return 4 weeks or prn.  I spent 25 minutes providing this consultation,  from 1000 to 1025. More than 50% of the time in this consultation was spent coordinating communication.   HISTORY OF PRESENT ILLNESS:  Dustin Hicks Sethi is a 85 y.o. year old male with multiple medical  problems including CKD, BPH, Depression, constipation, CVA, DM, h/o wounds, urine retention.. Palliative Care was asked to follow this patient by consultation request of Drue FlirtHenry-Mallori Araque, Carol, MD to help address advance care planning and goals of care. This is a follow up visit.  CODE STATUS:  DNR, MOST pending PPS: 30% HOSPICE ELIGIBILITY/DIAGNOSIS: TBD  PAST MEDICAL HISTORY:  Past Medical History:  Diagnosis Date  . BPH (benign prostatic hyperplasia)   . Chronic kidney disease   . Constipation   . Depression   . Diabetes (HCC)   . Dysphagia   . Edema   . GERD (gastroesophageal reflux disease)   . Hypertension   . Hypokalemia   . Iron deficiency anemia   . Osteoporosis   . Pressure ulcer    right heel, stage 3 per  . Stroke (HCC)   . UTI (urinary tract infection)     SOCIAL HX:  Social History   Tobacco Use  . Smoking status: Never Smoker  . Smokeless tobacco: Never Used  Substance Use Topics  . Alcohol use: Never    Frequency: Never    ALLERGIES: No Known Allergies   PERTINENT MEDICATIONS:  Outpatient Encounter Medications as of 09/30/2018  Medication Sig  . acetaminophen (TYLENOL) 325 MG tablet Take 650 mg by mouth every 4 (four) hours as needed for mild pain or fever.  Marland Kitchen. acetic acid 0.25 % irrigation Irrigate with 1 application as directed at bedtime.  Marland Kitchen. aspirin 81 MG chewable tablet Chew 81 mg by mouth daily.  . calcium carbonate (TUMS - DOSED IN MG ELEMENTAL CALCIUM) 500 MG chewable tablet Chew 1 tablet by mouth daily.  . Cholecalciferol  25 MCG (1000 UT) capsule Take 1,000 Units by mouth daily.  . citalopram (CELEXA) 20 MG tablet Take 20 mg by mouth daily.  Marland Kitchen dutasteride (AVODART) 0.5 MG capsule Take 0.5 mg by mouth daily.  . ferrous sulfate 220 (44 Fe) MG/5ML solution Take 330 mg by mouth daily.  . furosemide (LASIX) 20 MG tablet Take 20 mg by mouth daily.   Marland Kitchen guaiFENesin (ROBITUSSIN) 100 MG/5ML SOLN Take 10 mLs by mouth every 4 (four) hours as needed for cough or  to loosen phlegm.  . insulin glargine (LANTUS) 100 UNIT/ML injection Inject 0.05 mLs (5 Units total) into the skin at bedtime.  . pantoprazole (PROTONIX) 40 MG tablet Take 40 mg by mouth daily.   . polyethylene glycol (MIRALAX / GLYCOLAX) packet Take 17 g by mouth daily as needed for mild constipation.  . potassium chloride (K-DUR) 10 MEQ tablet Take 10 mEq by mouth daily.  . QUEtiapine (SEROQUEL) 25 MG tablet Take 12.5 mg by mouth at bedtime.   . Throat Lozenges (COUGH DROPS MENTHOL) LOZG Use as directed 1 lozenge in the mouth or throat 4 (four) times daily as needed (sore throat).  . traMADol (ULTRAM) 50 MG tablet Take 1 tablet (50 mg total) by mouth 3 (three) times daily as needed for moderate pain.  . traZODone (DESYREL) 50 MG tablet Take 50 mg by mouth every evening.  . zinc oxide 20 % ointment Apply 1 application topically as needed for irritation. Xerosis cutis   No facility-administered encounter medications on file as of 09/30/2018.     PHYSICAL EXAM / ROS:   Current and past weights: 170 for August, 15 lb wt loss in 3 mos. General: NAD, frail appearing,  Cardiovascular: no chest pain reported, no edema, S1S2 Pulmonary: no cough, no increased SOB, lungs clear Abdomen: appetite good, some dysphagia, endorses constipation, incontinent of bowel GU: denies dysuria,suprapubic cath due to retention. MSK:  no joint deformities, non ambulatory Skin: bil heel calluses, not broken down. Neurological: Weakness, left hand tremor,   Jason Coop, NP  COVID-19 PATIENT SCREENING TOOL  Person answering questions: ____Staff_______________ _____   1.  Is the patient or any family member in the home showing any signs or symptoms regarding respiratory infection?               Person with Symptom- __na_________________________  a. Fever                                                                          Yes___ No___          ___________________  b. Shortness of breath                                                     Yes___ No___          ___________________ c. Cough/congestion                                       Yes___  No___         ___________________ d. Body aches/pains                                                         Yes___ No___        ____________________ e. Gastrointestinal symptoms (diarrhea, nausea)           Yes___ No___        ____________________  2. Within the past 14 days, has anyone living in the home had any contact with someone with or under investigation for COVID-19?    Yes___ No_x_   Person __________________

## 2018-11-02 ENCOUNTER — Other Ambulatory Visit: Payer: Self-pay

## 2018-11-02 ENCOUNTER — Non-Acute Institutional Stay: Payer: Medicare Other | Admitting: Primary Care

## 2018-11-02 DIAGNOSIS — Z515 Encounter for palliative care: Secondary | ICD-10-CM

## 2018-11-02 NOTE — Progress Notes (Addendum)
[facility]er[facility] Palliative Care Consult Note Telephone: [phone]  Fax: [phone]  TELEHEALTH VISIT STATEMENT Due to the COVID-19 crisis, this visit was done via telemedicine from my office. It was initiated and consented to by this patient and/or family.  PATIENT NAME: Dustin Hicks DOB: [birth year] MRN: [id number]  PRIMARY CARE PROVIDER:   Marisa Hua, MD, [place] [address] [phone]  REFERRING PROVIDER:  Marisa Hua, MD [place],  [place] [address] [phone]  RESPONSIBLE PARTY:   Extended Emergency Contact Information Primary Emergency Contact: [place] Phone: [phone] Work Phone: [phone] Mobile Phone: [phone] Relation: Daughter   ASSESSMENT AND RECOMMENDATIONS:   1. Advance Care Planning/Goals of Care: Goals include to maximize quality of life and symptom management. T/c to [place] to discuss his status. She wants to visit once things open. We discussed her usual visits to drop off items to him which she has not been able to do. Her goal would be for him to be shaven and dressed daily.  2. Symptom Management:   Nutrition: Recommend supplements for weight gain. Continues to lose weight. Eats 100% of meals per staff report. States hunger and thirst. Recommend a bedside water container so he can drink at will. Daughter wants him to be OOB for meals.  ADLs: Discussed dressing with daughter. She states he usually is up with a polo shirt and needs daily shaving.  3. Family /Caregiver/Community Supports: Family in area, daughter is POA,  lives in [facility].  4. Cognitive / Functional decline: A and O x 1. Dependent in Adls.  5. Follow up Palliative Care Visit: Palliative care will continue to follow for goals of care clarification and symptom management. Return 4-6 weeks or prn.  I spent 25 minutes providing this consultation,  from 1000 to 1025. More than 50% of  the time in this consultation was spent coordinating communication.   HISTORY OF PRESENT ILLNESS:  Baby Stairs is a 85 y.o. year old male with multiple medical problems including CKD, BPH, Depression, constipation, CVA, DM, h/o wounds, urine retention. Palliative Care was asked to follow this patient by consultation request of Marisa Hua, MD to help address advance care planning and goals of care. This is a follow up visit.  CODE STATUS: DNR  PPS: 30% HOSPICE ELIGIBILITY/DIAGNOSIS: TBD  PAST MEDICAL HISTORY:  Past Medical History:  Diagnosis Date  . BPH (benign prostatic hyperplasia)   . Chronic kidney disease   . Constipation   . Depression   . Diabetes ([place])   . Dysphagia   . Edema   . GERD (gastroesophageal reflux disease)   . Hypertension   . Hypokalemia   . Iron deficiency anemia   . Osteoporosis   . Pressure ulcer    right heel, stage 3 per  . Stroke ([place])   . UTI (urinary tract infection)     SOCIAL HX:  Social History   Tobacco Use  . Smoking status: Never Smoker  . Smokeless tobacco: Never Used  Substance Use Topics  . Alcohol use: Never    Frequency: Never    ALLERGIES: No Known Allergies   PERTINENT MEDICATIONS:  Outpatient Encounter Medications as of 11/02/2018  Medication Sig  . acetaminophen (TYLENOL) 325 MG tablet Take 650 mg by mouth every 4 (four) hours as needed for mild pain or fever.  Marland Kitchen acetic acid 0.25 % irrigation Irrigate with 1 application as directed at bedtime.  Marland Kitchen aspirin 81 MG chewable  tablet Chew 81 mg by mouth daily.  . calcium carbonate (TUMS - DOSED IN MG ELEMENTAL CALCIUM) 500 MG chewable tablet Chew 1 tablet by mouth daily.  . Cholecalciferol 25 MCG (1000 UT) capsule Take 1,000 Units by mouth daily.  . citalopram (CELEXA) 20 MG tablet Take 20 mg by mouth daily.  Marland Kitchen. dutasteride (AVODART) 0.5 MG capsule Take 0.5 mg by mouth daily.  . ferrous sulfate 220 (44 Fe) MG/5ML solution Take 330 mg by mouth daily.  . furosemide (LASIX)  20 MG tablet Take 20 mg by mouth daily.   Marland Kitchen. guaiFENesin (ROBITUSSIN) 100 MG/5ML SOLN Take 10 mLs by mouth every 4 (four) hours as needed for cough or to loosen phlegm.  . insulin glargine (LANTUS) 100 UNIT/ML injection Inject 0.05 mLs (5 Units total) into the skin at bedtime.  . pantoprazole (PROTONIX) 40 MG tablet Take 40 mg by mouth daily.   . polyethylene glycol (MIRALAX / GLYCOLAX) packet Take 17 g by mouth daily as needed for mild constipation.  . potassium chloride (K-DUR) 10 MEQ tablet Take 10 mEq by mouth daily.  . QUEtiapine (SEROQUEL) 25 MG tablet Take 12.5 mg by mouth at bedtime.   . Throat Lozenges (COUGH DROPS MENTHOL) LOZG Use as directed 1 lozenge in the mouth or throat 4 (four) times daily as needed (sore throat).  . traMADol (ULTRAM) 50 MG tablet Take 1 tablet (50 mg total) by mouth 3 (three) times daily as needed for moderate pain.  . traZODone (DESYREL) 50 MG tablet Take 50 mg by mouth every evening.  . zinc oxide 20 % ointment Apply 1 application topically as needed for irritation. Xerosis cutis   No facility-administered encounter medications on file as of 11/02/2018.     PHYSICAL EXAM / ROS:   Current and past weights: 166 lbs today, wt 170 lbs in August. General: NAD, frail appearing, staff states he has been shaven.  Cardiovascular: no chest pain reported, no edema,  Pulmonary: no cough, no increased SOB, room air Abdomen: appetite good, denies constipation, incontinent of bowel GU: denies dysuria, incontinent of urine MSK:  no joint deformities, non ambulatory, in bed today, usually oob in chair Skin: no rashes or wounds reported per staff Neurological: Weakness, dementia, needs help, cueing, feeding, dressing  Marijo FileKathryn M Machai Desmith DNP AGPCNP-BC

## 2018-11-03 ENCOUNTER — Non-Acute Institutional Stay: Payer: Medicare Other | Admitting: Primary Care

## 2018-11-04 ENCOUNTER — Non-Acute Institutional Stay: Payer: Medicare Other | Admitting: Primary Care

## 2018-12-15 ENCOUNTER — Non-Acute Institutional Stay: Payer: Medicare Other | Admitting: Primary Care

## 2018-12-15 ENCOUNTER — Other Ambulatory Visit: Payer: Self-pay

## 2018-12-15 DIAGNOSIS — Z515 Encounter for palliative care: Secondary | ICD-10-CM

## 2018-12-15 NOTE — Progress Notes (Signed)
[facility] Palliative Care Consult Note Telephone: [phone]  Fax: [phone]  TELEHEALTH VISIT STATEMENT Due to the COVID-19 crisis, this visit was done via telemedicine from my office. It was initiated and consented to by this patient and/or family.  PATIENT NAME: Dustin Hicks [address] [place] [address] [phone] (home)  DOB: 1929-01-13 MRN: [id number]  PRIMARY CARE PROVIDER:   Marisa Hua, MD, [place] [address] [phone]  REFERRING PROVIDER:  Marisa Hua, MD [place],  [place] [address] [phone]  RESPONSIBLE PARTY:   Extended Emergency Contact Information Primary Emergency Contact: [place] Phone: [phone] Work Phone: [phone] Mobile Phone: [phone] Relation: Daughter   ASSESSMENT AND RECOMMENDATIONS:   1. Advance Care Planning/Goals of Care: Goals include to maximize quality of life and symptom management. Patient has DNR. T/c to POA to report visit, discuss any concerns. Discussed visit with daughter Olin Hauser.  2. Symptom Management:   Nutrition: Appears WNWD. He does forget he eats and often says he has not eaten. Weight is fluctuating a few pounds but seems stable the past few months.   UTI: Patient has catheter for retention and it appears to be functioning well and patent. No recent report of UTI or behavior changes.   3. Family /Caregiver/Community Supports: Daughter in area, lives in [place] receiving full time nursing care.  4. Cognitive / Functional decline:  Appears stable, at baseline of moderate to advanced cognitive impairment, in bed today but sometimes is in chair.   5. Follow up Palliative Care Visit: Palliative care will continue to follow for goals of care clarification and symptom management. Return 6-8 weeks or prn.  I spent 25 minutes providing this consultation,  from 1100 to 1125. More than 50% of the time in this  consultation was spent coordinating communication.   HISTORY OF PRESENT ILLNESS:  [name] is a 85 y.o. year old male with multiple medical problems including cognitive impairment, catheter use, DM. Palliative Care was asked to follow this patient by consultation request of Marisa Hua, MD to help address advance care planning and goals of care. This is a follow up visit.  CODE STATUS: DNR   PPS: 30% HOSPICE ELIGIBILITY/DIAGNOSIS: TBD  PAST MEDICAL HISTORY:  Past Medical History:  Diagnosis Date  . BPH (benign prostatic hyperplasia)   . Chronic kidney disease   . Constipation   . Depression   . Diabetes ([place])   . Dysphagia   . Edema   . GERD (gastroesophageal reflux disease)   . Hypertension   . Hypokalemia   . Iron deficiency anemia   . Osteoporosis   . Pressure ulcer    right heel, stage 3 per  . Stroke ([place])   . UTI (urinary tract infection)     SOCIAL HX:  Social History   Tobacco Use  . Smoking status: Never Smoker  . Smokeless tobacco: Never Used  Substance Use Topics  . Alcohol use: Never    Frequency: Never    ALLERGIES: No Known Allergies   PERTINENT MEDICATIONS:  Outpatient Encounter Medications as of 12/15/2018  Medication Sig  . acetaminophen (TYLENOL) 325 MG tablet Take 650 mg by mouth every 4 (four) hours as needed for mild pain or fever.  Marland Kitchen acetic acid 0.25 % irrigation Irrigate with 1 application as directed at bedtime.  Marland Kitchen aspirin 81 MG chewable tablet Chew 81 mg by mouth daily.  . calcium carbonate (TUMS - DOSED IN MG ELEMENTAL  CALCIUM) 500 MG chewable tablet Chew 1 tablet by mouth daily.  . Cholecalciferol 25 MCG (1000 UT) capsule Take 1,000 Units by mouth daily.  . citalopram (CELEXA) 20 MG tablet Take 20 mg by mouth daily.  Marland Kitchen dutasteride (AVODART) 0.5 MG capsule Take 0.5 mg by mouth daily.  . ferrous sulfate 220 (44 Fe) MG/5ML solution Take 330 mg by mouth daily.  . furosemide (LASIX) 20 MG tablet Take 20 mg by mouth daily.   Marland Kitchen  guaiFENesin (ROBITUSSIN) 100 MG/5ML SOLN Take 10 mLs by mouth every 4 (four) hours as needed for cough or to loosen phlegm.  . insulin glargine (LANTUS) 100 UNIT/ML injection Inject 0.05 mLs (5 Units total) into the skin at bedtime.  . pantoprazole (PROTONIX) 40 MG tablet Take 40 mg by mouth daily.   . polyethylene glycol (MIRALAX / GLYCOLAX) packet Take 17 g by mouth daily as needed for mild constipation.  . potassium chloride (K-DUR) 10 MEQ tablet Take 10 mEq by mouth daily.  . QUEtiapine (SEROQUEL) 25 MG tablet Take 12.5 mg by mouth at bedtime.   . Throat Lozenges (COUGH DROPS MENTHOL) LOZG Use as directed 1 lozenge in the mouth or throat 4 (four) times daily as needed (sore throat).  . traMADol (ULTRAM) 50 MG tablet Take 1 tablet (50 mg total) by mouth 3 (three) times daily as needed for moderate pain.  . traZODone (DESYREL) 50 MG tablet Take 50 mg by mouth every evening.  . zinc oxide 20 % ointment Apply 1 application topically as needed for irritation. Xerosis cutis   No facility-administered encounter medications on file as of 12/15/2018.     PHYSICAL EXAM / ROS:  97.7-83-17 134/80 Current and past weights: 166lb, 4 lb loss since 8/20 General: NAD, frail appearing Cardiovascular: no chest pain reported, no edema  reported Pulmonary: no cough, no increased SOB, no cyanosis Abdomen: appetite good,  Denies constipation, incontinent of bowel, BS 93-143 range over several months., FBS GU: denies dysuria, suprapubic catheter MSK:  no joint deformities, non ambulatory Skin: no rashes or wounds reported, dry skin on heels Neurological: Weakness, dementia/cognitive impairment, states he has not eaten when he has, unable to answer simple questions   Marijo File DNP AGPCNP-BC

## 2018-12-16 ENCOUNTER — Non-Acute Institutional Stay: Payer: Medicare Other | Admitting: Primary Care

## 2019-02-10 ENCOUNTER — Other Ambulatory Visit: Payer: Self-pay

## 2019-02-10 ENCOUNTER — Non-Acute Institutional Stay: Payer: Medicare Other | Admitting: Primary Care

## 2019-02-10 DIAGNOSIS — Z515 Encounter for palliative care: Secondary | ICD-10-CM

## 2019-02-10 NOTE — Progress Notes (Signed)
Therapist, nutritional Palliative Care Consult Note Telephone: [phone]  Fax: [phone]  TELEHEALTH VISIT STATEMENT Due to the COVID-19 crisis, this visit was done via telemedicine from my office. It was initiated and consented to by this patient and/or family.  PATIENT NAME: Dustin Hicks [address] [place] [address] [phone] (home)  DOB: Aug 08, 1928 MRN: [id number]  PRIMARY CARE PROVIDER:   Drue Flirt, MD, [address] [place] [address] [phone]  REFERRING PROVIDER:  Drue Flirt, MD [address] [place],  [place] [address] [phone]  RESPONSIBLE PARTY:   Extended Emergency Contact Information Primary Emergency Contact: Allred,Pamela Home Phone: [phone] Work Phone: [phone] Mobile Phone: [phone] Relation: Daughter   ASSESSMENT AND RECOMMENDATIONS:   1. Advance Care Planning/Goals of Care: Goals include to maximize quality of life and symptom management. DNR on file. POA is daughter.  2. Symptom Management:  Nutrition: Appears good, eats most of meals. Wt 166 and stable. BG is stable and within normal fasting range.  Mood:  Appears calm, comfortable. Has his shirt in place as sometimes he takes off clothing. WNL, has a urinary catheter so monitor for uti and AMS.   3. Family /Caregiver/Community Supports: POA is daughter, lives in LTC.   4. Cognitive / Functional decline: Alert, oriented x 1-2. 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 Appears at his functional baseline.   5. Follow up Palliative Care Visit: Palliative care will continue to follow for goals of care clarification and symptom management. Return 8-12 weeks or prn.  I spent 15 minutes providing this consultation,  from 1015 to 1030. More than 50% of the time in this consultation was spent coordinating communication.   HISTORY OF PRESENT ILLNESS:  Dustin Hicks is a 85 y.o. year old male with multiple medical problems  including dementia, DM, urinary retention,. Palliative Care was asked to follow this patient by consultation request of Drue Flirt, MD to help address advance care planning and goals of care. This is a follow up visit.  CODE STATUS: DNR  PPS: 30% HOSPICE ELIGIBILITY/DIAGNOSIS: no  PAST MEDICAL HISTORY:  Past Medical History:  Diagnosis Date  . BPH (benign prostatic hyperplasia)   . Chronic kidney disease   . Constipation   . Depression   . Diabetes ([facility])   . Dysphagia   . Edema   . GERD (gastroesophageal reflux disease)   . Hypertension   . Hypokalemia   . Iron deficiency anemia   . Osteoporosis   . Pressure ulcer    right heel, stage 3 per  . Stroke ([facility])   . UTI (urinary tract infection)     SOCIAL HX:  Social History   Tobacco Use  . Smoking status: Never Smoker  . Smokeless tobacco: Never Used  Substance Use Topics  . Alcohol use: Never    ALLERGIES: No Known Allergies   PERTINENT MEDICATIONS:  Outpatient Encounter Medications as of 02/10/2019  Medication Sig  . acetaminophen (TYLENOL) 325 MG tablet Take 650 mg by mouth every 4 (four) hours as needed for mild pain or fever.  Marland Kitchen acetic acid 0.25 % irrigation Irrigate with 1 application as directed at bedtime.  Marland Kitchen aspirin 81 MG chewable tablet Chew 81 mg by mouth daily.  . calcium carbonate (TUMS - DOSED IN MG ELEMENTAL CALCIUM) 500 MG chewable tablet Chew 1 tablet by mouth daily.  . Cholecalciferol 25 MCG (1000 UT) capsule Take 1,000 Units by mouth daily.  . citalopram (CELEXA) 20 MG tablet Take  20 mg by mouth daily.  Marland Kitchen dutasteride (AVODART) 0.5 MG capsule Take 0.5 mg by mouth daily.  . ferrous sulfate 220 (44 Fe) MG/5ML solution Take 330 mg by mouth daily.  . furosemide (LASIX) 20 MG tablet Take 20 mg by mouth daily.   . insulin glargine (LANTUS) 100 UNIT/ML injection Inject 0.05 mLs (5 Units total) into the skin at bedtime.  . pantoprazole (PROTONIX) 40 MG tablet Take 40 mg by mouth daily.   . potassium  chloride (K-DUR) 10 MEQ tablet Take 10 mEq by mouth daily.  . QUEtiapine (SEROQUEL) 25 MG tablet Take 12.5 mg by mouth at bedtime.   . traMADol (ULTRAM) 50 MG tablet Take 1 tablet (50 mg total) by mouth 3 (three) times daily as needed for moderate pain.  . traZODone (DESYREL) 50 MG tablet Take 50 mg by mouth every evening.  . zinc oxide 20 % ointment Apply 1 application topically as needed for irritation. Xerosis cutis  . guaiFENesin (ROBITUSSIN) 100 MG/5ML SOLN Take 10 mLs by mouth every 4 (four) hours as needed for cough or to loosen phlegm.  . polyethylene glycol (MIRALAX / GLYCOLAX) packet Take 17 g by mouth daily as needed for mild constipation. (Patient not taking: Reported on 02/10/2019)  . Throat Lozenges (COUGH DROPS MENTHOL) LOZG Use as directed 1 lozenge in the mouth or throat 4 (four) times daily as needed (sore throat).   No facility-administered encounter medications on file as of 02/10/2019.     PHYSICAL EXAM / ROS:   Current and past weights: 166-164 lbs General: NAD, frail appearing, WNWD Cardiovascular: no chest pain reported, no edema Pulmonary: no cough, no increased SOB Abdomen: appetite good, FBS WNL, denies constipation, incontinent of bowel GU: denies dysuria, Urinary catheter MSK:  no joint deformities, non ambulatory Skin: no rashes or wounds reported Neurological: Weakness, dementia stage 7 B.  Jason Coop, NP [facility]

## 2019-02-16 ENCOUNTER — Non-Acute Institutional Stay: Payer: Medicare Other | Admitting: Primary Care

## 2019-02-17 ENCOUNTER — Non-Acute Institutional Stay: Payer: Medicare Other | Admitting: Primary Care

## 2019-02-22 ENCOUNTER — Ambulatory Visit: Payer: Medicare Other | Admitting: Cardiovascular Disease

## 2019-03-02 ENCOUNTER — Other Ambulatory Visit: Payer: Self-pay

## 2019-03-02 ENCOUNTER — Encounter: Payer: Self-pay | Admitting: Family

## 2019-03-02 ENCOUNTER — Ambulatory Visit (INDEPENDENT_AMBULATORY_CARE_PROVIDER_SITE_OTHER): Payer: Medicare Other | Admitting: Family

## 2019-03-02 VITALS — BP 102/46 | HR 58 | Ht 68.0 in | Wt 167.1 lb

## 2019-03-02 DIAGNOSIS — I441 Atrioventricular block, second degree: Secondary | ICD-10-CM | POA: Diagnosis not present

## 2019-03-02 DIAGNOSIS — R001 Bradycardia, unspecified: Secondary | ICD-10-CM

## 2019-03-02 DIAGNOSIS — I4892 Unspecified atrial flutter: Secondary | ICD-10-CM | POA: Diagnosis not present

## 2019-03-02 DIAGNOSIS — D649 Anemia, unspecified: Secondary | ICD-10-CM | POA: Diagnosis not present

## 2019-03-02 DIAGNOSIS — N183 Chronic kidney disease, stage 3 unspecified: Secondary | ICD-10-CM

## 2019-03-02 DIAGNOSIS — I350 Nonrheumatic aortic (valve) stenosis: Secondary | ICD-10-CM

## 2019-03-02 NOTE — Patient Instructions (Signed)
Office visit note printed and given to nursing home staff.   Medication Instructions:  Your physician recommends that you continue on your current medications as directed. Please refer to the Current Medication list given to you today.  *If you need a refill on your cardiac medications before your next appointment, please call your pharmacy*  Lab Work: none If you have labs (blood work) drawn today and your tests are completely normal, you will receive your results only by: Marland Kitchen MyChart Message (if you have MyChart) OR . A paper copy in the mail If you have any lab test that is abnormal or we need to change your treatment, we will call you to review the results.  Testing/Procedures: none  Follow-Up: At [facility], you and your health needs are our priority.  As part of our continuing mission to provide you with exceptional heart care, we have created designated Provider Care Teams.  These Care Teams include your primary Cardiologist (physician) and Advanced Practice Providers (APPs -  Physician Assistants and Nurse Practitioners) who all work together to provide you with the care you need, when you need it.  Your next appointment:   6 month(s)  The format for your next appointment:   In Person  Provider:    You may see Lorine Bears, MD if possible.

## 2019-03-02 NOTE — Progress Notes (Signed)
Office Visit    Patient Name: Dustin Hicks Date of Encounter: 03/02/2019  Primary Care Provider:  Drue Flirt, MD Primary Cardiologist:  Lorine Bears, MD Electrophysiologist:  None   Chief Complaint    Dustin Hicks is a 86 y.o. male with a hx of atrial flutter not on OSA, mod-severe AS, Mobitz I, CVA, CKD III, anemia, large granular lymphocytosis, DM2, HTN, ESBL UTI, BPH, osteoporosis, GERD. Presents today for 3 month follow up of atrial flutter.    Past Medical History    Past Medical History:  Diagnosis Date  . BPH (benign prostatic hyperplasia)   . Chronic kidney disease   . Constipation   . Depression   . Diabetes (HCC)   . Dysphagia   . Edema   . GERD (gastroesophageal reflux disease)   . Hypertension   . Hypokalemia   . Iron deficiency anemia   . Osteoporosis   . Pressure ulcer    right heel, stage 3 per  . Stroke (HCC)   . UTI (urinary tract infection)    Past Surgical History:  Procedure Laterality Date  . IR CATHETER TUBE CHANGE  06/11/2018  . IR CATHETER TUBE CHANGE  07/09/2018  . SUPRAPUBIC CATHETER INSERTION      Allergies  No Known Allergies  History of Present Illness    Dustin Hicks is a 86 y.o. male with a hx of hx of atrial flutter not on OSA, mod-severe AS, Mobitz I, CVA, CKD III, anemia, large granular lymphocytosis, DM2, HTN, ESBL UTI, BPH, osteoporosis, GERD. Last seen by Eula Listen, PA on 08/2018.  Hospitalized [facility] 08/20/2018-08/27/2018 for metabolic encephalopathy secondary to dehydration pneumonia with a new diagnosis of atrial flutter with slow ventricular response as well as Mobitz type I AV block and intermittent 2-1 AV block.  Prior to hospitalization no known cardiac history.  Echo 08/21/2018 EF 55 to 60%, LVH with severe basal septal hypertrophy, normal RV systolic function, normal RV size, mildly dilated LA, severe mitral annular calcification, moderate to severe aortic stenosis with possible low-flow low Creacy gradient severe  aortic stenosis given valve area below 0.8cm^2, grossly normal tricuspid valve, moderate MR.  Given patient's comorbidities included advanced age and AMS not felt to be a good candidate for anticoagulation nor AVR.  Not entirely certain he would be good candidate for PPM.  He is following with palliative care in the nursing home.  History is assisted by the nursing home staff member in the room present with him.  However she is not his primary caretaker at the facility and thus history is somewhat limited.  His chief complaint today is that he is hungry.  He denies chest pain, pressure, tightness.  He denies shortness of breath nor DOE.  He reports no lower extremity edema.  He denies lightheadedness, dizziness, near syncope.  However, this history is limited by his dementia.  Paperwork from the SNF includes diagnosis, medications, recent vitals, but no lab work. Staff member tells me labs are collected routinely, but were not printed for today's visit unfortunately. His HR is well controlled 60s-80s. BP routinely <130/80 with no noted episodes of hypotension. His weight appears overall stable.   EKGs/Labs/Other Studies Reviewed:   The following studies were reviewed today:  Echo 08/21/18 1. The left ventricle has normal systolic function, with an ejection fraction of 55-60%. The cavity size was normal. There is moderate concentric left ventricular hypertrophy with severe basal septal hypertrophy.  2. The right ventricle has normal systolic function. The cavity  was normal. There is no increase in right ventricular wall thickness. Right ventricular systolic pressure could not be assessed.  3. Left atrial size was mildly dilated.  4. Severe mitral annular dilatation.  5. The aortic valve is abnormal. Moderate thickening of the aortic valve. Severe calcifcation of the aortic valve. Moderate-severe stenosis of the aortic valve. Possible low flow low gradient severe stenosis given valve area below 0.8  .  6. The tricuspid valve is grossly normal.  7. The mitral valve is abnormal. Mitral valve regurgitation is moderate by color flow Doppler.  8. The inferior vena cava was dilated in size with >50% respiratory variability.  EKG:  EKG is ordered today.  The ekg ordered today demonstrates atrial flutter 58 bpm.  Recent Labs: 08/20/2018: ALT 12 08/23/2018: Magnesium 1.9; TSH 0.585 08/24/2018: BUN 23; Creatinine, Ser 1.35; Hemoglobin 11.6; Platelets 171; Potassium 4.4; Sodium 140  Recent Lipid Panel No results found for: CHOL, TRIG, HDL, CHOLHDL, VLDL, LDLCALC, LDLDIRECT  Home Medications   Current Meds  Medication Sig  . acetaminophen (TYLENOL) 325 MG tablet Take 650 mg by mouth every 4 (four) hours as needed for mild pain or fever.  Marland Kitchen acetic acid 0.25 % irrigation Irrigate with 1 application as directed at bedtime.  Marland Kitchen aspirin 81 MG chewable tablet Chew 81 mg by mouth daily.  . calcium carbonate (TUMS - DOSED IN MG ELEMENTAL CALCIUM) 500 MG chewable tablet Chew 1 tablet by mouth daily.  . Cholecalciferol 25 MCG (1000 UT) capsule Take 1,000 Units by mouth daily.  . citalopram (CELEXA) 20 MG tablet Take 20 mg by mouth daily.  Marland Kitchen dutasteride (AVODART) 0.5 MG capsule Take 0.5 mg by mouth daily.  . ferrous sulfate 220 (44 Fe) MG/5ML solution Take 330 mg by mouth daily.  . furosemide (LASIX) 20 MG tablet Take 20 mg by mouth daily.   Marland Kitchen guaiFENesin (ROBITUSSIN) 100 MG/5ML SOLN Take 10 mLs by mouth every 4 (four) hours as needed for cough or to loosen phlegm.  . insulin glargine (LANTUS) 100 UNIT/ML injection Inject 0.05 mLs (5 Units total) into the skin at bedtime.  . pantoprazole (PROTONIX) 40 MG tablet Take 40 mg by mouth daily.   . polyethylene glycol (MIRALAX / GLYCOLAX) packet Take 17 g by mouth daily as needed for mild constipation.  . potassium chloride (K-DUR) 10 MEQ tablet Take 10 mEq by mouth daily.  . QUEtiapine (SEROQUEL) 25 MG tablet Take 12.5 mg by mouth at bedtime.   . Throat Lozenges  (COUGH DROPS MENTHOL) LOZG Use as directed 1 lozenge in the mouth or throat 4 (four) times daily as needed (sore throat).  . traMADol (ULTRAM) 50 MG tablet Take 1 tablet (50 mg total) by mouth 3 (three) times daily as needed for moderate pain.  . traZODone (DESYREL) 50 MG tablet Take 50 mg by mouth every evening.  . zinc oxide 20 % ointment Apply 1 application topically as needed for irritation. Xerosis cutis   Review of Systems    Review of Systems  Constitution: Negative for chills, fever and malaise/fatigue.  Cardiovascular: Negative for chest pain, dyspnea on exertion, leg swelling, near-syncope, orthopnea, palpitations and syncope.  Respiratory: Negative for cough, shortness of breath and wheezing.   Gastrointestinal: Negative for nausea and vomiting.  Neurological: Negative for dizziness, light-headedness and weakness.   All other systems reviewed and are otherwise negative except as noted above.  Physical Exam    VS:  BP (!) 102/46 (BP Location: Left Arm, Patient Position: Sitting, Cuff Size:  Normal)   Pulse (!) 58   Wt 167 lb 2 oz (75.8 kg)   SpO2 96%   BMI 25.41 kg/m  , BMI Body mass index is 25.41 kg/m. GEN: Well nourished, thin, well developed, in no acute distress. HEENT: normal. Neck: Supple, no JVD, carotid bruits, or masses. Cardiac: irregular, no rubs, or gallops. Gr 3/6 systolic murmur. No clubbing, cyanosis, edema.  Radials/PT 2+ and equal bilaterally.  Respiratory:  Respirations regular and unlabored, clear to auscultation bilaterally. GI: Soft, nontender, nondistended. Catheter in place - unclear whether condom catheter or foley catheter. MS: No deformity or atrophy. Skin: Warm and dry, no rash. Neuro:  Strength and sensation are intact. Alert to self and place.  Psych: Normal affect.  Accessory Clinical Findings    ECG personally reviewed by me today -rate controlled atrial flutter 58 bpm with no acute ST/T wave changes.- no acute changes.  Assessment &  Plan    1. Atrial flutter -diagnosed in/2020 during hospitalization.  Not currently on rate limiting medication secondary to ongoing bradycardia.  Rate controlled today on EKG.  Not a candidate for long-term full dose oral anticoagulation secondary to mental status and frequent falls.  2. Sinus bradycardia with Mobitz type I AV block with intermittent 2-1 AV block -asymptomatic.  At baseline per nursing staff.  Unlikely candidate for pacer even with high degree AV block secondary to underlying dementia/AMS/frequent falls/advanced age.  Continue to avoid AV nodal blocking agents.  3. Moderate to severe aortic stenosis with moderate mitral regurgitation -unlikely to be a candidate for invasive intervention given significant, conditions.  Appears relatively asymptomatic/stable. Though history is limited - he denies chest pain, near-syncope, SOB.  4. CKD 3 -follows with PCP at SNF.  Unfortunately labs were not brought for review today.  Careful titration of antihypertensive and diuretic therapies.  5. Normocytic anemia - Follows with PCP at the SNF.  Report no concerns concerning bleeding.  6. Dementia -Per nursing staff at baseline.  Disposition: Follow up in 6 month(s) with Dr. Tor Netters, NP 03/02/2019, 2:08 PM

## 2019-03-12 ENCOUNTER — Emergency Department: Payer: Medicare Other

## 2019-03-12 ENCOUNTER — Other Ambulatory Visit: Payer: Self-pay

## 2019-03-12 ENCOUNTER — Emergency Department
Admission: EM | Admit: 2019-03-12 | Discharge: 2019-03-12 | Disposition: A | Discharge status: Home / Self Care | Payer: Medicare Other | Attending: Emergency Medicine | Admitting: Emergency Medicine

## 2019-03-12 DIAGNOSIS — Y939 Activity, unspecified: Secondary | ICD-10-CM | POA: Diagnosis not present

## 2019-03-12 DIAGNOSIS — Z794 Long term (current) use of insulin: Secondary | ICD-10-CM | POA: Insufficient documentation

## 2019-03-12 DIAGNOSIS — E1122 Type 2 diabetes mellitus with diabetic chronic kidney disease: Secondary | ICD-10-CM | POA: Diagnosis not present

## 2019-03-12 DIAGNOSIS — S0990XA Unspecified injury of head, initial encounter: Secondary | ICD-10-CM | POA: Diagnosis present

## 2019-03-12 DIAGNOSIS — Z23 Encounter for immunization: Secondary | ICD-10-CM | POA: Insufficient documentation

## 2019-03-12 DIAGNOSIS — W1809XA Striking against other object with subsequent fall, initial encounter: Secondary | ICD-10-CM | POA: Insufficient documentation

## 2019-03-12 DIAGNOSIS — I129 Hypertensive chronic kidney disease with stage 1 through stage 4 chronic kidney disease, or unspecified chronic kidney disease: Secondary | ICD-10-CM | POA: Diagnosis not present

## 2019-03-12 DIAGNOSIS — N189 Chronic kidney disease, unspecified: Secondary | ICD-10-CM | POA: Insufficient documentation

## 2019-03-12 DIAGNOSIS — Y92129 Unspecified place in nursing home as the place of occurrence of the external cause: Secondary | ICD-10-CM | POA: Diagnosis not present

## 2019-03-12 DIAGNOSIS — Y999 Unspecified external cause status: Secondary | ICD-10-CM | POA: Insufficient documentation

## 2019-03-12 DIAGNOSIS — S0001XA Abrasion of scalp, initial encounter: Secondary | ICD-10-CM | POA: Diagnosis not present

## 2019-03-12 MED ORDER — TETANUS-DIPHTH-ACELL PERTUSSIS 5-2.5-18.5 LF-MCG/0.5 IM SUSP
0.5000 mL | Freq: Once | INTRAMUSCULAR | Status: AC
  Administered 2019-03-12: 23:00:00 0.5 mL via INTRAMUSCULAR
  Filled 2019-03-12: qty 0.5

## 2019-03-12 NOTE — ED Provider Notes (Signed)
[facility] Emergency Department Provider Note  ____________________________________________  Time seen: Approximately 11:00 PM  I have reviewed the triage vital signs and the nursing notes.   HISTORY  Chief Complaint Fall    Level 5 Caveat: Portions of the History and Physical including HPI and review of systems are unable to be completely obtained due to patient being a poor historian   HPI Shayan Bramhall is a 86 y.o. male with a history of constipation, depression, GERD, hypertension who comes the ED from nursing facility due to an unwitnessed fall while getting out of bed, reportedly hit the right side of his head on the dresser. Patient notes that his left arm is broken and has been for 2 weeks, following up with orthopedics. Not on blood thinners. Denies any significant pain, no neck pain.      Past Medical History:  Diagnosis Date  . BPH (benign prostatic hyperplasia)   . Chronic kidney disease   . Constipation   . Depression   . Diabetes (HCC)   . Dysphagia   . Edema   . GERD (gastroesophageal reflux disease)   . Hypertension   . Hypokalemia   . Iron deficiency anemia   . Osteoporosis   . Pressure ulcer    right heel, stage 3 per  . Stroke (HCC)   . UTI (urinary tract infection)      Patient Active Problem List   Diagnosis Date Noted  . Pneumonia 08/20/2018  . Urinary retention 11/03/2017  . Pressure injury of skin 06/09/2017  . Complicated UTI (urinary tract infection) 06/08/2017  . Chronic kidney disease 11/11/2016  . Lymphocytosis 11/11/2016  . Type 2 diabetes mellitus, with long-term current use of insulin (HCC) 11/11/2016     Past Surgical History:  Procedure Laterality Date  . IR CATHETER TUBE CHANGE  06/11/2018  . IR CATHETER TUBE CHANGE  07/09/2018  . SUPRAPUBIC CATHETER INSERTION       Prior to Admission medications   Medication Sig Start Date End Date Taking? Authorizing Provider  acetaminophen (TYLENOL) 325 MG  tablet Take 650 mg by mouth every 4 (four) hours as needed for mild pain or fever.    [provider]  acetic acid 0.25 % irrigation Irrigate with 1 application as directed at bedtime.    [provider]  aspirin 81 MG chewable tablet Chew 81 mg by mouth daily.    [provider]  calcium carbonate (TUMS - DOSED IN MG ELEMENTAL CALCIUM) 500 MG chewable tablet Chew 1 tablet by mouth daily.    [provider]  Cholecalciferol 25 MCG (1000 UT) capsule Take 1,000 Units by mouth daily.    [provider]  citalopram (CELEXA) 20 MG tablet Take 20 mg by mouth daily.    [provider]  dutasteride (AVODART) 0.5 MG capsule Take 0.5 mg by mouth daily.    [provider]  ferrous sulfate 220 (44 Fe) MG/5ML solution Take 330 mg by mouth daily.    [provider]  furosemide (LASIX) 20 MG tablet Take 20 mg by mouth daily.     [provider]  guaiFENesin (ROBITUSSIN) 100 MG/5ML SOLN Take 10 mLs by mouth every 4 (four) hours as needed for cough or to loosen phlegm.    [provider]  insulin glargine (LANTUS) 100 UNIT/ML injection Inject 0.05 mLs (5 Units total) into the skin at bedtime. 08/24/18   Mayo, Allyn Kenner, MD  pantoprazole (PROTONIX) 40 MG tablet Take 40 mg by  mouth daily.     [provider]  polyethylene glycol (MIRALAX / GLYCOLAX) packet Take 17 g by mouth daily as needed for mild constipation. 06/12/17   Houston Siren, MD  potassium chloride (K-DUR) 10 MEQ tablet Take 10 mEq by mouth daily.    [provider]  QUEtiapine (SEROQUEL) 25 MG tablet Take 12.5 mg by mouth at bedtime.     [provider]  Throat Lozenges (COUGH DROPS MENTHOL) LOZG Use as directed 1 lozenge in the mouth or throat 4 (four) times daily as needed (sore throat).    [provider]  traMADol (ULTRAM) 50 MG tablet Take 1 tablet (50 mg total) by mouth 3 (three) times daily as needed for moderate pain.  06/12/17   Houston Siren, MD  traZODone (DESYREL) 50 MG tablet Take 50 mg by mouth every evening.    [provider]  zinc oxide 20 % ointment Apply 1 application topically as needed for irritation. Xerosis cutis    [provider]     Allergies Patient has no known allergies.   Family History  Problem Relation Age of Onset  . Muscular dystrophy Daughter     Social History Social History   Tobacco Use  . Smoking status: Never Smoker  . Smokeless tobacco: Never Used  Substance Use Topics  . Alcohol use: Never  . Drug use: Never    Review of Systems Level 5 Caveat: Portions of the History and Physical including HPI and review of systems are unable to be completely obtained due to patient being a poor historian   Constitutional:   No known fever.  ENT:   No rhinorrhea. Cardiovascular:   No chest pain or syncope. Respiratory:   No dyspnea or cough. Gastrointestinal:   Negative for abdominal pain, vomiting and diarrhea.  Musculoskeletal:   Subacute left arm pain due to recent fracture ____________________________________________   PHYSICAL EXAM:  VITAL SIGNS: ED Triage Vitals  Enc Vitals Group     BP 03/12/19 2248 (!) 154/60     Pulse Rate 03/12/19 2041 (!) 58     Resp 03/12/19 2041 16     Temp 03/12/19 2041 97.9 F (36.6 C)     Temp Source 03/12/19 2041 Oral     SpO2 03/12/19 2041 100 %     Weight 03/12/19 2044 167 lb 1.7 oz (75.8 kg)     Height 03/12/19 2044 5\' 8"  (1.727 m)     Head Circumference --      Peak Flow --      Pain Score --      Pain Loc --      Pain Edu? --      Excl. in GC? --     Vital signs reviewed, nursing assessments reviewed.   Constitutional:   Alert and oriented. Non-toxic appearance. Eyes:   Conjunctivae are normal. EOMI. PERRL. ENT      Head:   Normocephalic with abrasion and contusion over the apex of the scalp. Hemostatic.      Nose:   No congestion/rhinnorhea. No epistaxis      Mouth/Throat:   MMM, no  pharyngeal erythema. No peritonsillar mass. No intraoral injury      Neck:   No meningismus. Full ROM. No midline spinal tenderness Hematological/Lymphatic/Immunilogical:   No cervical lymphadenopathy. Cardiovascular:   RRR. Symmetric bilateral radial and DP pulses.  No murmurs. Cap refill less than 2 seconds. Respiratory:   Normal respiratory effort without tachypnea/retractions. Breath sounds are clear  and equal bilaterally. No wheezes/rales/rhonchi. Gastrointestinal:   Soft and nontender. Non distended. There is no CVA tenderness.  No rebound, rigidity, or guarding.  Musculoskeletal:   Normal range of motion in all extremities. No joint effusions.  No lower extremity tenderness.  No edema. Neurologic:   Normal speech and language.  Motor grossly intact. Fine tremor. No acute focal neurologic deficits are appreciated.  Skin:    Skin is warm, dry and intact. No rash noted.  No petechiae, purpura, or bullae.  ____________________________________________    LABS (pertinent positives/negatives) (all labs ordered are listed, but only abnormal results are displayed) Labs Reviewed - No data to display ____________________________________________   EKG  Interpreted by me Sinus rhythm rate of 55, normal axis and intervals. Normal QRS ST segments. Isolated T wave inversion in lead III which is nonspecific  ____________________________________________    RADIOLOGY  CT HEAD WO CONTRAST  Result Date: 03/12/2019 CLINICAL DATA:  Acute pain due to trauma. Pain a right-sided head. Swollen right eye. EXAM: CT HEAD WITHOUT CONTRAST CT CERVICAL SPINE WITHOUT CONTRAST TECHNIQUE: Multidetector CT imaging of the head and cervical spine was performed following the standard protocol without intravenous contrast. Multiplanar CT image reconstructions of the cervical spine were also generated. COMPARISON:  None. FINDINGS: CT HEAD FINDINGS Brain: No evidence of acute infarction, hemorrhage, hydrocephalus,  extra-axial collection or mass lesion/mass effect. Atrophy and chronic microvascular ischemic changes are noted bilaterally. Vascular: No hyperdense vessel or unexpected calcification. Skull: There is right frontal scalp swelling without evidence for an underlying calvarial fracture. Sinuses/Orbits: No acute finding. Other: None. CT CERVICAL SPINE FINDINGS Alignment: There is straightening of the normal cervical lordotic curvature. Skull base and vertebrae: No acute fracture. No primary bone lesion or focal pathologic process. Soft tissues and spinal canal: No prevertebral fluid or swelling. No visible canal hematoma. Disc levels: Multilevel degenerative changes are noted throughout the cervical spine. Upper chest: Negative. Other: There is a mass centered in the right parotid gland measuring approximately 2.9 x 1.7 cm. This mass appears to contain both cystic and solid components. IMPRESSION: 1. No CT evidence for acute intracranial pathology. 2. Right frontal scalp swelling without evidence for underlying calvarial fracture. 3. No evidence for acute fracture of the cervical spine. 4. 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 This is amenable to percutaneous biopsy as clinically indicated. Electronically Signed   By: Katherine Mantle M.D.   On: 03/12/2019 22:15   CT CERVICAL SPINE WO CONTRAST  Result Date: 03/12/2019 CLINICAL DATA:  Acute pain due to trauma. Pain a right-sided head. Swollen right eye. EXAM: CT HEAD WITHOUT CONTRAST CT CERVICAL SPINE WITHOUT CONTRAST TECHNIQUE: Multidetector CT imaging of the head and cervical spine was performed following the standard protocol without intravenous contrast. Multiplanar CT image reconstructions of the cervical spine were also generated. COMPARISON:  None. FINDINGS: CT HEAD FINDINGS Brain: No evidence of acute infarction, hemorrhage, hydrocephalus, extra-axial collection or mass lesion/mass effect. Atrophy and chronic microvascular ischemic  changes are noted bilaterally. Vascular: No hyperdense vessel or unexpected calcification. Skull: There is right frontal scalp swelling without evidence for an underlying calvarial fracture. Sinuses/Orbits: No acute finding. Other: None. CT CERVICAL SPINE FINDINGS Alignment: There is straightening of the normal cervical lordotic curvature. Skull base and vertebrae: No acute fracture. No primary bone lesion or focal pathologic process. Soft tissues and spinal canal: No prevertebral fluid or swelling. No visible canal hematoma. Disc levels: Multilevel degenerative changes are noted throughout the cervical spine. Upper chest: Negative. Other: There  is a mass centered in the right parotid gland measuring approximately 2.9 x 1.7 cm. This mass appears to contain both cystic and solid components. IMPRESSION: 1. No CT evidence for acute intracranial pathology. 2. Right frontal scalp swelling without evidence for underlying calvarial fracture. 3. No evidence for acute fracture of the cervical spine. 4. 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 This is amenable to percutaneous biopsy as clinically indicated. Electronically Signed   By: Constance Holster M.D.   On: 03/12/2019 22:15    ____________________________________________   PROCEDURES Procedures  ____________________________________________  DIFFERENTIAL DIAGNOSIS   Intracranial hemorrhage, C-spine fracture  CLINICAL IMPRESSION / ASSESSMENT AND PLAN / ED COURSE  Medications ordered in the ED: Medications  Tdap (BOOSTRIX) injection 0.5 mL (has no administration in time range)    Pertinent labs & imaging results that were available during my care of the patient were reviewed by me and considered in my medical decision making (see chart for details).   Bralynn Donado was evaluated in Emergency Department on 03/12/2019 for the symptoms described in the history of present illness. He was evaluated in the context of the global COVID-19  pandemic, which necessitated consideration that the patient might be at risk for infection with the SARS-CoV-2 virus that causes COVID-19. Institutional protocols and algorithms that pertain to the evaluation of patients at risk for COVID-19 are in a state of rapid change based on information released by regulatory bodies including the CDC and federal and state organizations. These policies and algorithms were followed during the patient's care in the ED.   Patient presents with a fall at his residence. No acute symptoms currently, exam is reassuring, vital signs reassuring. EKG unremarkable. CT scan of the head and neck obtained to evaluate for intracranial hemorrhage or C-spine fracture, these are also unremarkable. Tetanus updated today, wound care provided, stable for discharge home to follow-up with primary care.      ____________________________________________   FINAL CLINICAL IMPRESSION(S) / ED DIAGNOSES    Final diagnoses:  Abrasion of scalp, initial encounter     ED Discharge Orders    None      Portions of this note were generated with dragon dictation software. Dictation errors may occur despite best attempts at proofreading.   Carrie Mew, MD 03/12/19 [phone]

## 2019-03-12 NOTE — ED Triage Notes (Signed)
Pt arrives [facility] from [facility] . Pt had unwitnessed fall getting out of bed and hit right side of head on his dresser. Pt was found laying on stomach w feet still in bed. R eye noted to be swollen. EMS states bleeding had stopped when they arrived.  163/98 HR 80 96% room air

## 2019-03-12 NOTE — ED Notes (Signed)
ED Provider at bedside. 

## 2019-03-12 NOTE — Discharge Instructions (Addendum)
Your CT scan of the head and neck were unremarkable today. Continue taking all your medicines and follow-up with your primary care doctor.

## 2019-04-14 ENCOUNTER — Other Ambulatory Visit: Payer: Self-pay

## 2019-04-14 ENCOUNTER — Non-Acute Institutional Stay: Payer: Medicare Other | Admitting: Primary Care

## 2019-04-14 DIAGNOSIS — Z515 Encounter for palliative care: Secondary | ICD-10-CM

## 2019-04-14 NOTE — Progress Notes (Signed)
Therapist, nutritional Palliative Care Consult Note Telephone: [phone]  Fax: [phone]  TELEHEALTH VISIT STATEMENT Due to the COVID-19 crisis, this visit was done via telemedicine from my office. It was initiated and consented to by this patient and/or family.  PATIENT NAME: Dustin Hicks [address] [place] [address] [phone] (home)  DOB: November 27, 1928 MRN: [id number]  PRIMARY CARE PROVIDER:   Drue Flirt, MD, [address] [place] [address] [phone]  REFERRING PROVIDER:  Drue Flirt, MD [address] [place],  [place] [address] [phone]  RESPONSIBLE PARTY:   Extended Emergency Contact Information Primary Emergency Contact: Hicks,Dustin Home Phone: [phone] Work Phone: [phone] Mobile Phone: [phone] Relation: Daughter  I conducted a visit today with Dustin Hicks and Dustin Hicks.  ASSESSMENT AND RECOMMENDATIONS:   1. Advance Care Planning/Goals of Care: Goals include to maximize quality of life and symptom management. POA is daughter Dustin Hicks. DNR on file.  2. Symptom Management:    Dustin Hicks said he was doing fine. He asked for drink of water and said he was thirsty. He is clad in only a shirt and briefs,  which is his usual attire. He denied being cold. He denied pain or other discomfort. He looked well nourished well developed and in no distress. No gross deficits following fall in 02/2019 with abrasion of scalp.  3. Family /Caregiver/Community Supports: Daughter is POA. Lives in LTC.  4. Cognitive / Functional decline: Alert, oriented to self. States hunger/ thirst. Needs assistance with all alds and iadls.   5. Follow up Palliative Care Visit: Palliative care will continue to follow for goals of care clarification and symptom management. Return 8-12 weeks or prn.  I spent 15 minutes providing this consultation,  from 1030 to 1045. More than 50% of the time in this consultation  was spent coordinating communication.   HISTORY OF PRESENT ILLNESS:  Dustin Hicks is a 86 y.o. year old male with multiple medical problems including dementia, DM, urinary retention. Palliative Care was asked to follow this patient by consultation request of Dustin Flirt, MD to help address advance care planning and goals of care. This is a follow up visit.  CODE STATUS: DNR  PPS: 30% HOSPICE ELIGIBILITY/DIAGNOSIS: TBD  PAST MEDICAL HISTORY:  Past Medical History:  Diagnosis Date  . BPH (benign prostatic hyperplasia)   . Chronic kidney disease   . Constipation   . Depression   . Diabetes (HCC)   . Dysphagia   . Edema   . GERD (gastroesophageal reflux disease)   . Hypertension   . Hypokalemia   . Iron deficiency anemia   . Osteoporosis   . Pressure ulcer    right heel, stage 3 per  . Stroke (HCC)   . UTI (urinary tract infection)     SOCIAL HX:  Social History   Tobacco Use  . Smoking status: Never Smoker  . Smokeless tobacco: Never Used  Substance Use Topics  . Alcohol use: Never    ALLERGIES: No Known Allergies   PERTINENT MEDICATIONS:  Outpatient Encounter Medications as of 04/14/2019  Medication Sig  . acetaminophen (TYLENOL) 325 MG tablet Take 650 mg by mouth every 4 (four) hours as needed for mild pain or fever.  Marland Kitchen acetic acid 0.25 % irrigation Irrigate with 1 application as directed at bedtime.  Marland Kitchen aspirin 81 MG chewable tablet Chew 81 mg by mouth daily.  . calcium carbonate (TUMS - DOSED IN MG ELEMENTAL CALCIUM) 500 MG chewable  tablet Chew 1 tablet by mouth daily.  . Cholecalciferol 25 MCG (1000 UT) capsule Take 1,000 Units by mouth daily.  . citalopram (CELEXA) 20 MG tablet Take 20 mg by mouth daily.  Marland Kitchen dutasteride (AVODART) 0.5 MG capsule Take 0.5 mg by mouth daily.  . ferrous sulfate 220 (44 Fe) MG/5ML solution Take 330 mg by mouth daily.  . furosemide (LASIX) 20 MG tablet Take 20 mg by mouth daily.   Marland Kitchen guaiFENesin (ROBITUSSIN) 100 MG/5ML SOLN Take  10 mLs by mouth every 4 (four) hours as needed for cough or to loosen phlegm.  . insulin glargine (LANTUS) 100 UNIT/ML injection Inject 0.05 mLs (5 Units total) into the skin at bedtime.  . pantoprazole (PROTONIX) 40 MG tablet Take 40 mg by mouth daily.   . polyethylene glycol (MIRALAX / GLYCOLAX) packet Take 17 g by mouth daily as needed for mild constipation.  . potassium chloride (K-DUR) 10 MEQ tablet Take 10 mEq by mouth daily.  . QUEtiapine (SEROQUEL) 25 MG tablet Take 12.5 mg by mouth at bedtime.   . Throat Lozenges (COUGH DROPS MENTHOL) LOZG Use as directed 1 lozenge in the mouth or throat 4 (four) times daily as needed (sore throat).  . traMADol (ULTRAM) 50 MG tablet Take 1 tablet (50 mg total) by mouth 3 (three) times daily as needed for moderate pain.  . traZODone (DESYREL) 50 MG tablet Take 50 mg by mouth every evening.  . zinc oxide 20 % ointment Apply 1 application topically as needed for irritation. Xerosis cutis   No facility-administered encounter medications on file as of 04/14/2019.    PHYSICAL EXAM / ROS:   Current and past weights: stable at 167 lbs. VSS General: NAD, frail appearing, WNWD Cardiovascular: no chest pain reported, no edema,  Pulmonary: no cough, no increased SOB, room air Abdomen: appetite good, 75-100%, FBS 98-134,  denies constipation, incontinent of bowel GU: denies dysuria, Catheter due to retention MSK:  no joint deformities,  Non ambulatory Skin: no rashes or wounds reported Neurological: Weakness, oriented to self.  Dustin Coop, NP [facility]

## 2019-05-18 ENCOUNTER — Telehealth: Payer: Self-pay | Admitting: Primary Care

## 2019-05-18 NOTE — Telephone Encounter (Signed)
Call from [facility]. Patient expired on June 11, 2019.

## 2019-05-19 DEATH — deceased

## 2019-06-27 IMAGING — CT CT IMAGE GUIDED DRAINAGE BY PERCUTANEOUS CATHETER
1 of 2 series · 14 of 32 positions shown, 19 images · non-contrast
Comparison: None.

INDICATION: Urethral erosion

EXAM:
CT-GUIDED SUPRAPUBIC BLADDER CATHETER

[Series 2: i-spiral 5.0 b30f · axial · 0.85mm/px · z∈[+857,+997]mm · 14 of 46 slices shown, 19 images]
[im 3/46  soft-tissue]
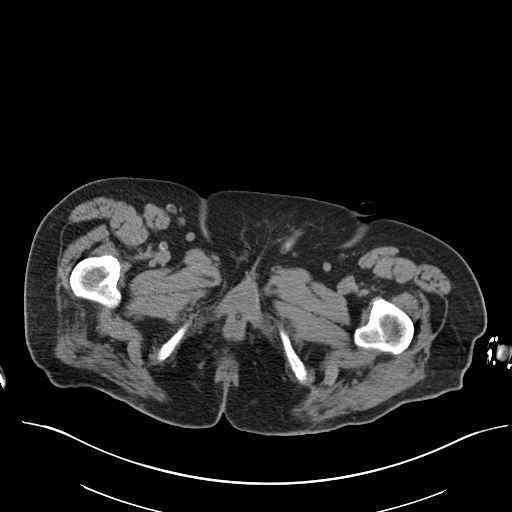
[im 3/46  bone]
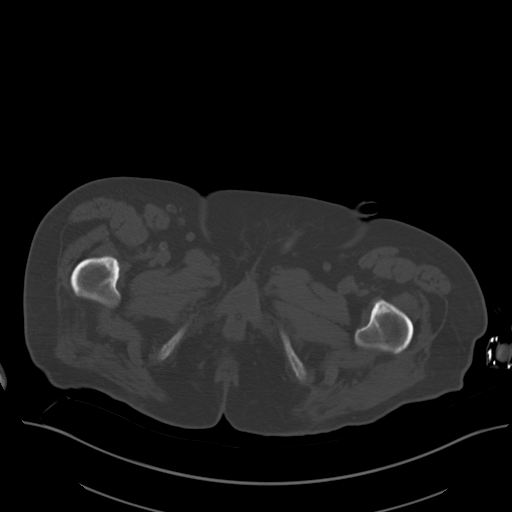
[im 6/46  soft-tissue]
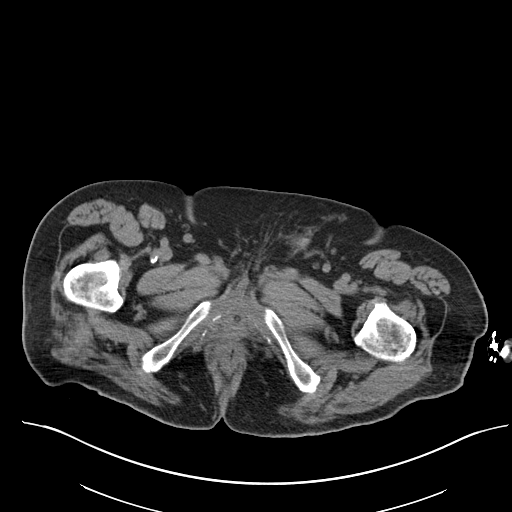
[im 11/46  soft-tissue]
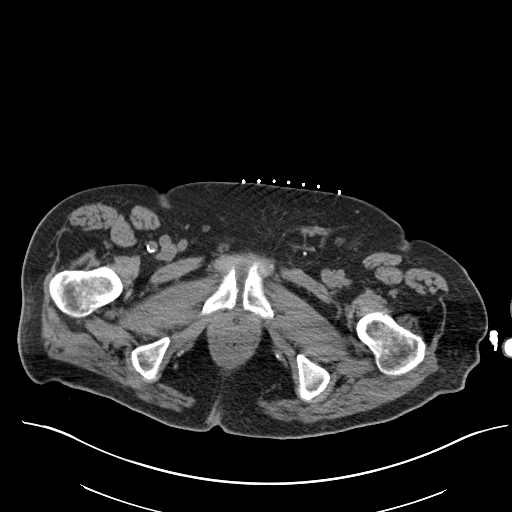
[im 14/46  soft-tissue]
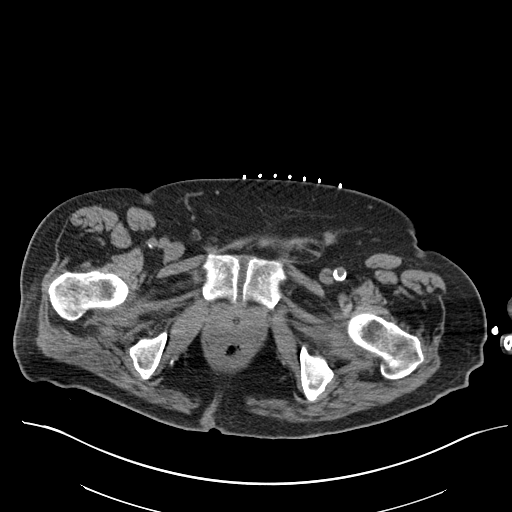
[im 16/46  soft-tissue]
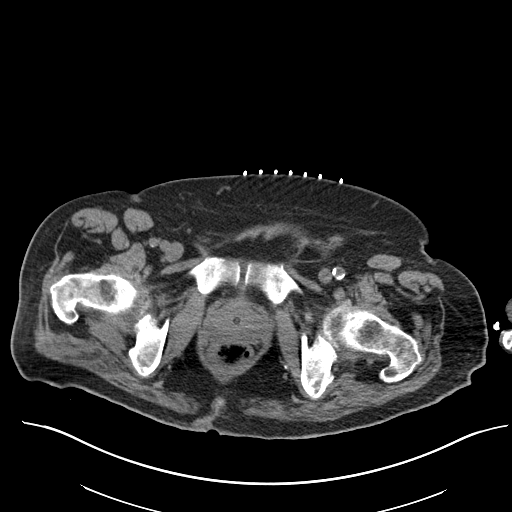
[im 19/46  soft-tissue]
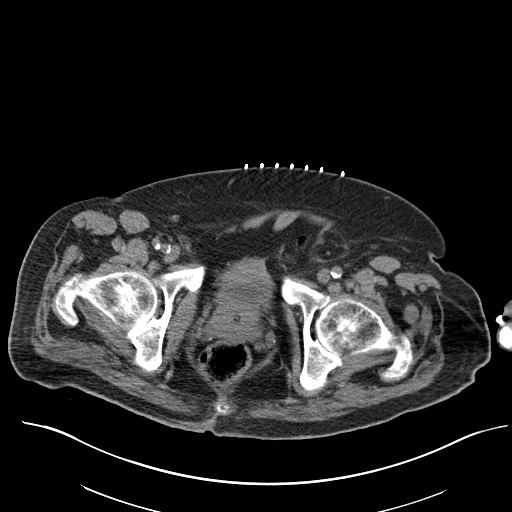
[im 24/46  soft-tissue]
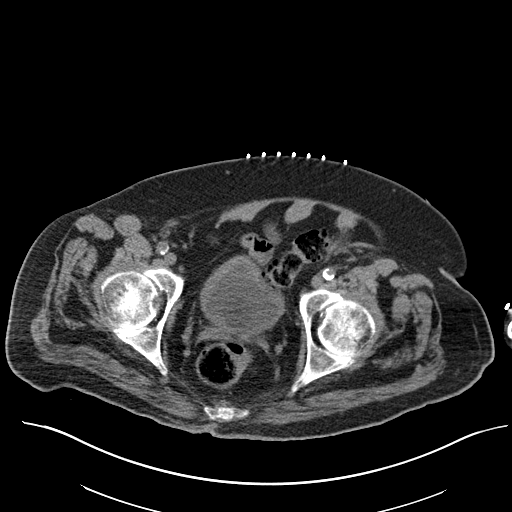
[im 27/46  soft-tissue]
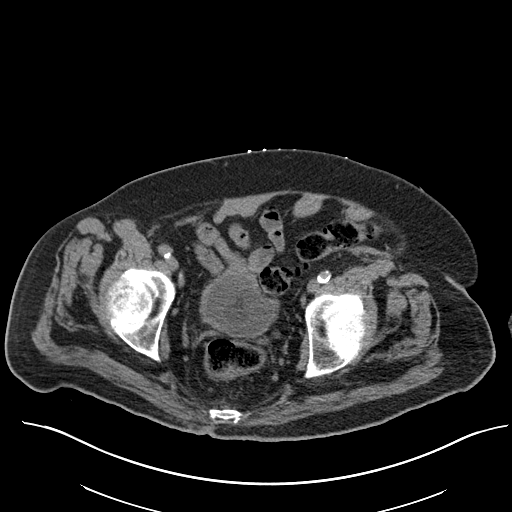
[im 30/46  soft-tissue]
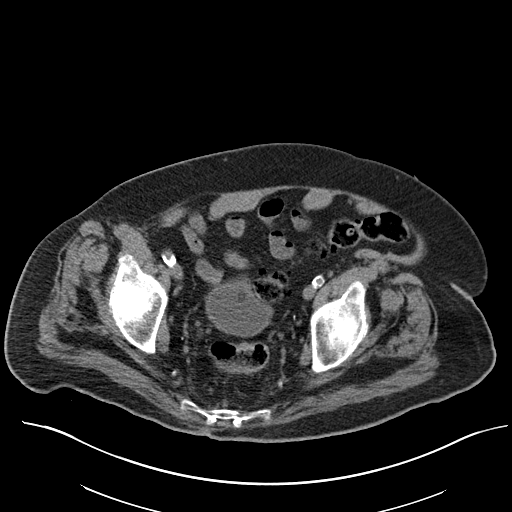
[im 30/46  bone]
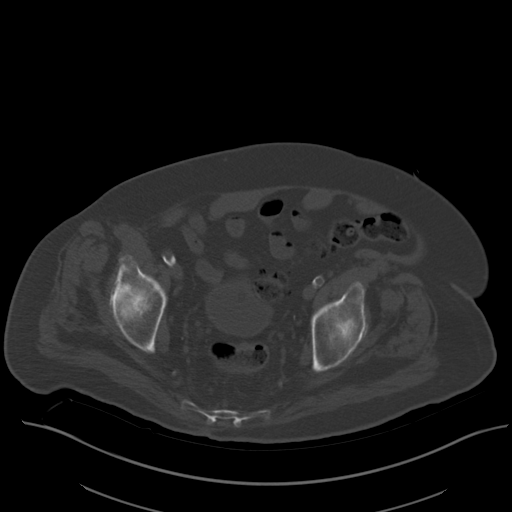
[im 32/46  soft-tissue]
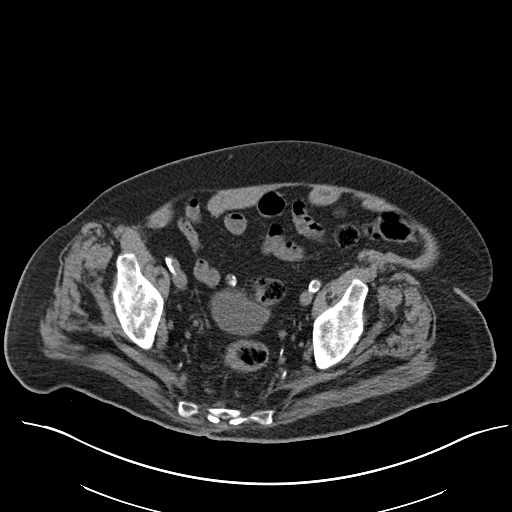
[im 35/46  soft-tissue]
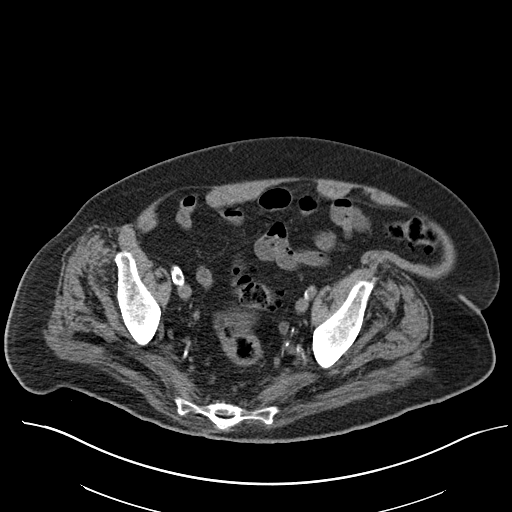
[im 35/46  lung]
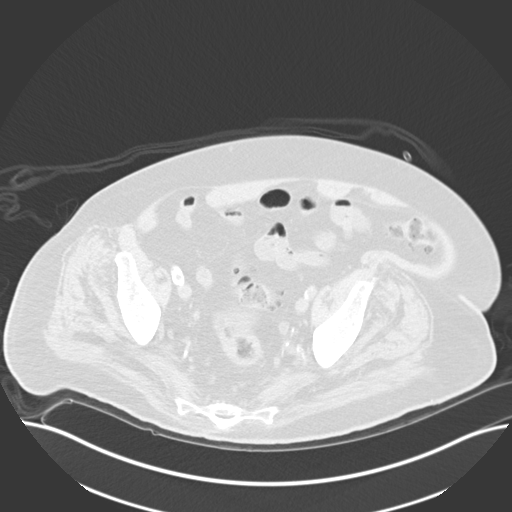
[im 38/46  lung]
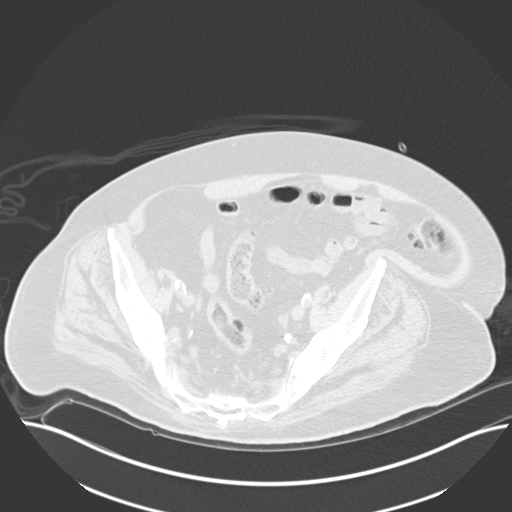
[im 40/46  soft-tissue]
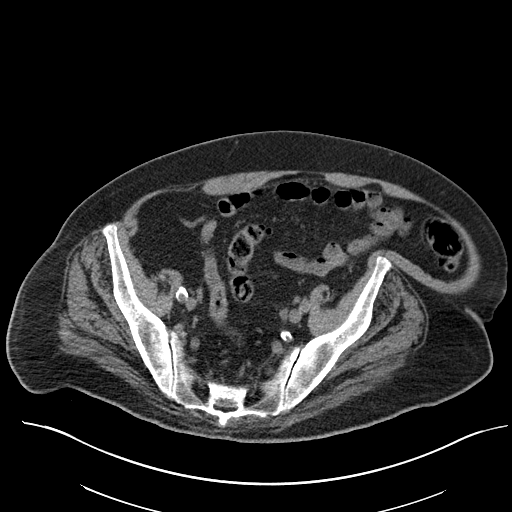
[im 40/46  lung]
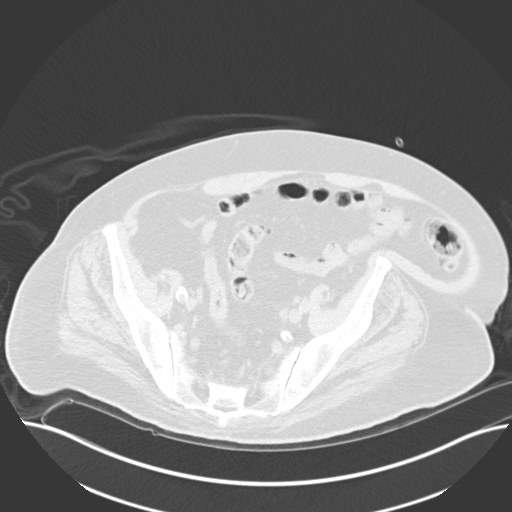
[im 43/46  soft-tissue]
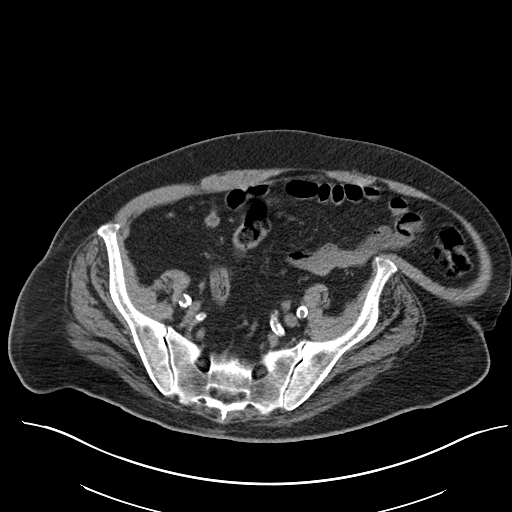
[im 43/46  lung]
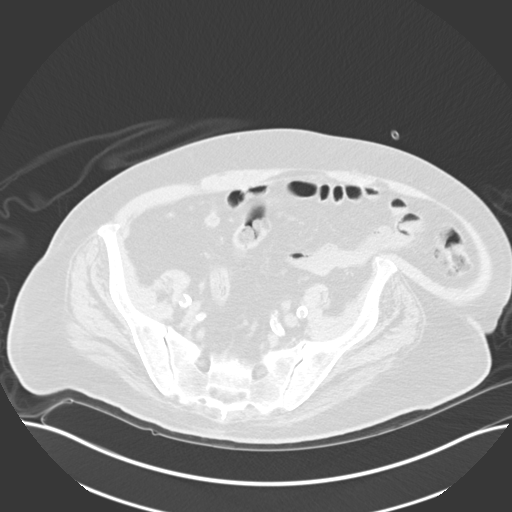

[14 of 32 positions shown; findings below may reference images not displayed]

MEDICATIONS:
Ancef 1 g.

ANESTHESIA/SEDATION:
Fentanyl 37.5 mcg IV; Versed 0.5 mg IV

Moderate Sedation Time:  16 minutes

The patient was continuously monitored during the procedure by the
interventional radiology nurse under my direct supervision.

CONTRAST:  None

FLUOROSCOPY TIME:  None

COMPLICATIONS:
None immediate.

PROCEDURE:
Informed written consent was obtained from the patient after a
thorough discussion of the procedural risks, benefits and
alternatives. All questions were addressed. Maximal Sterile Barrier
Technique was utilized including caps, mask, sterile gowns, sterile
gloves, sterile drape, hand hygiene and skin antiseptic. A timeout
was performed prior to the initiation of the procedure.

The lower abdomen was prepped and draped in a sterile fashion. 1%
lidocaine was utilized for local anesthesia. Under CT guidance, an
18 gauge needle was advanced into the bladder and removed over an
Amplatz wire. A 12 French dilator followed by a 12 French pigtail
drain was then advanced over the wire. It was looped and string
fixed in the bladder. It was sewn to the skin. Urine was aspirated.
FINDINGS: Images document 12 French pigtail drain placement into the bladder
via suprapubic approach.
IMPRESSION: Successful 12 French suprapubic bladder catheter placement. The
patient will return in 1 month to upsized to 16 French.

## 2019-09-01 ENCOUNTER — Ambulatory Visit: Payer: Medicare Other | Admitting: Cardiovascular Disease

## 2020-04-26 IMAGING — CT CT CERVICAL SPINE W/O CM
2 series · 9 of 29 positions shown, 11 images · non-contrast
Comparison: None.

CLINICAL DATA: Acute pain due to trauma. Pain a right-sided head.
Swollen right eye.

EXAM:
CT HEAD WITHOUT CONTRAST
CT CERVICAL SPINE WITHOUT CONTRAST
TECHNIQUE: Multidetector CT imaging of the head and cervical spine was
performed following the standard protocol without intravenous
contrast. Multiplanar CT image reconstructions of the cervical spine
were also generated.

[Series 3: c spine soft · axial · 0.32mm/px · z∈[+17,+101]mm · 4 of 62 slices shown, 5 images]
[im 10/62  soft-tissue]
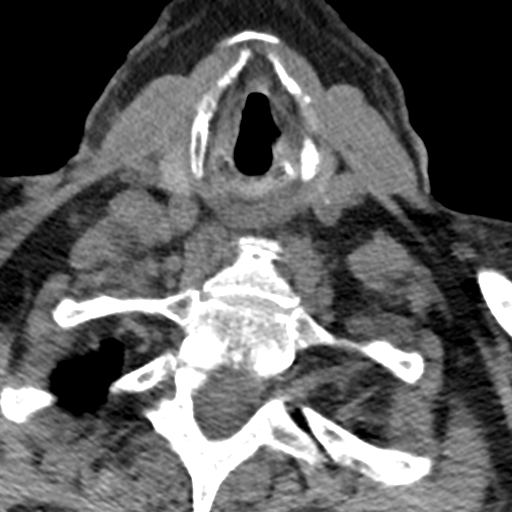
[im 10/62  bone]
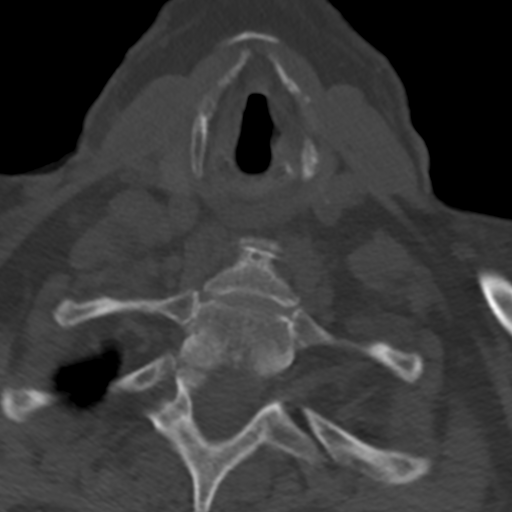
[im 24/62  bone]
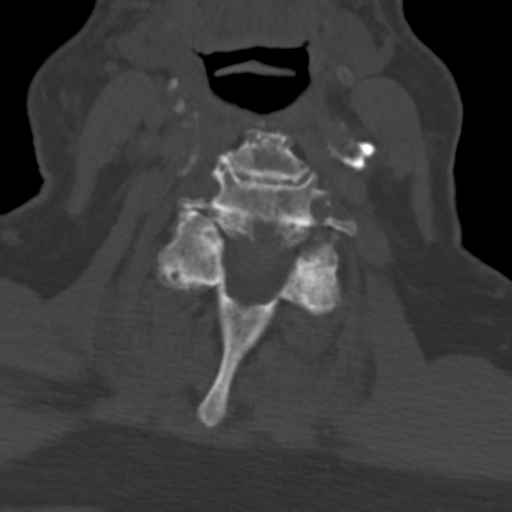
[im 38/62  bone]
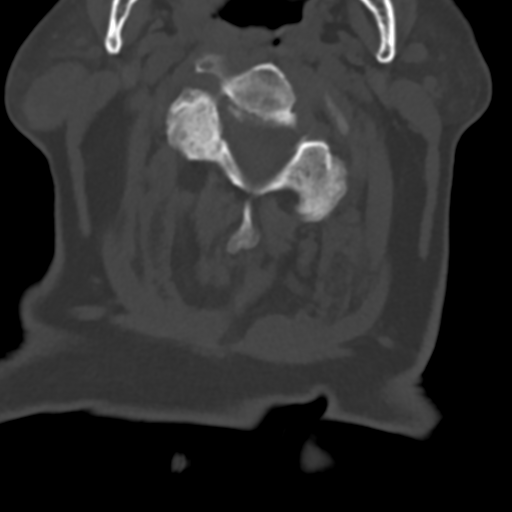
[im 52/62  bone]
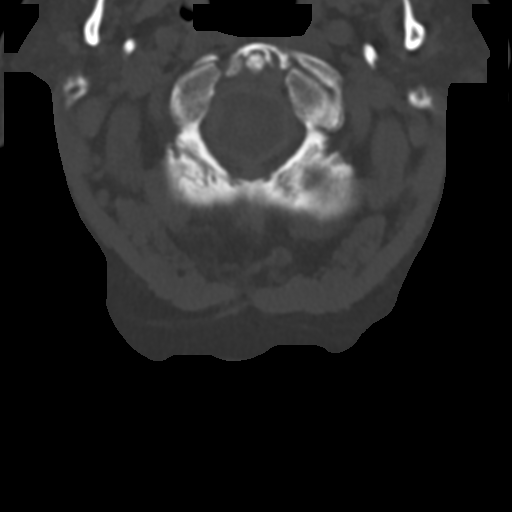

[Series 6: sagittal bone · sagittal · 0.21mm/px · 5 of 69 slices shown, 6 images]
[im 23/69  bone]
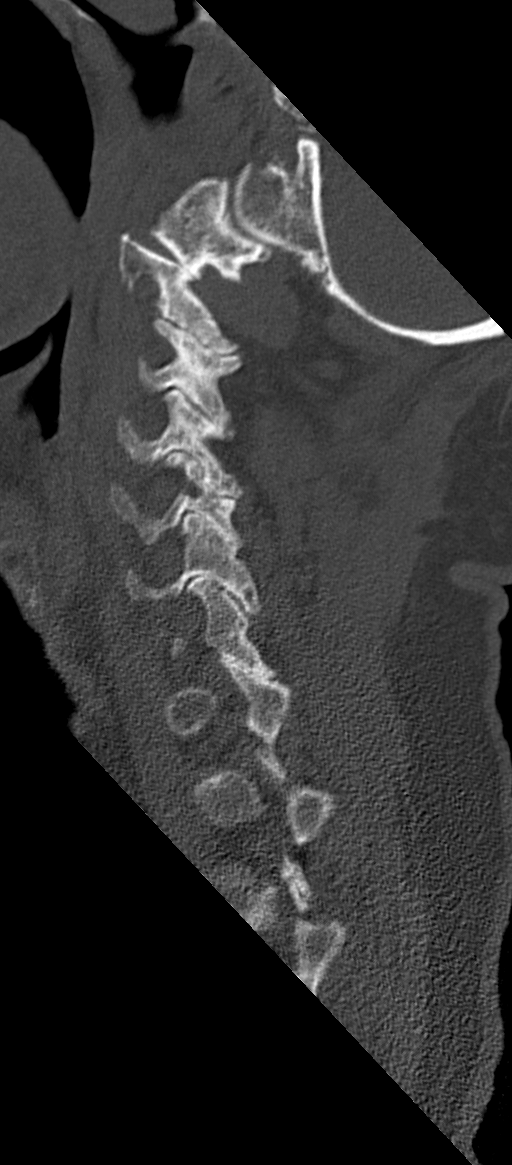
[im 29/69  bone]
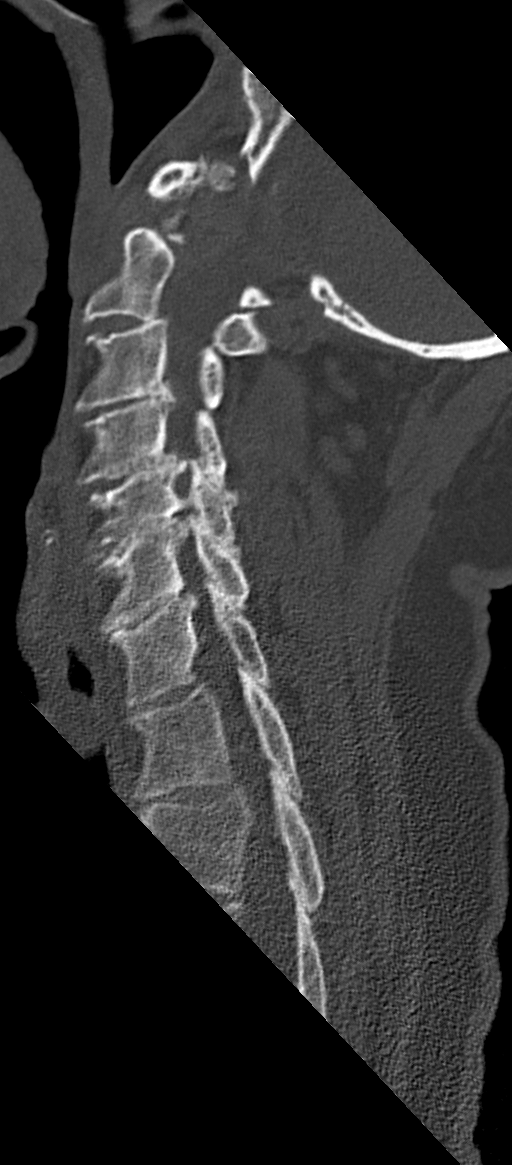
[im 35/69  soft-tissue]
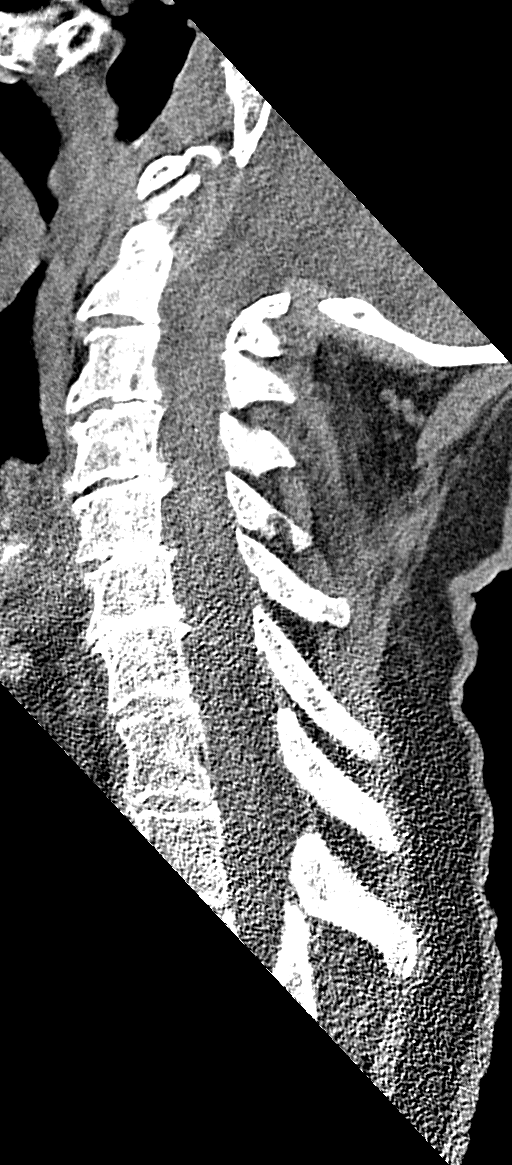
[im 35/69  bone]
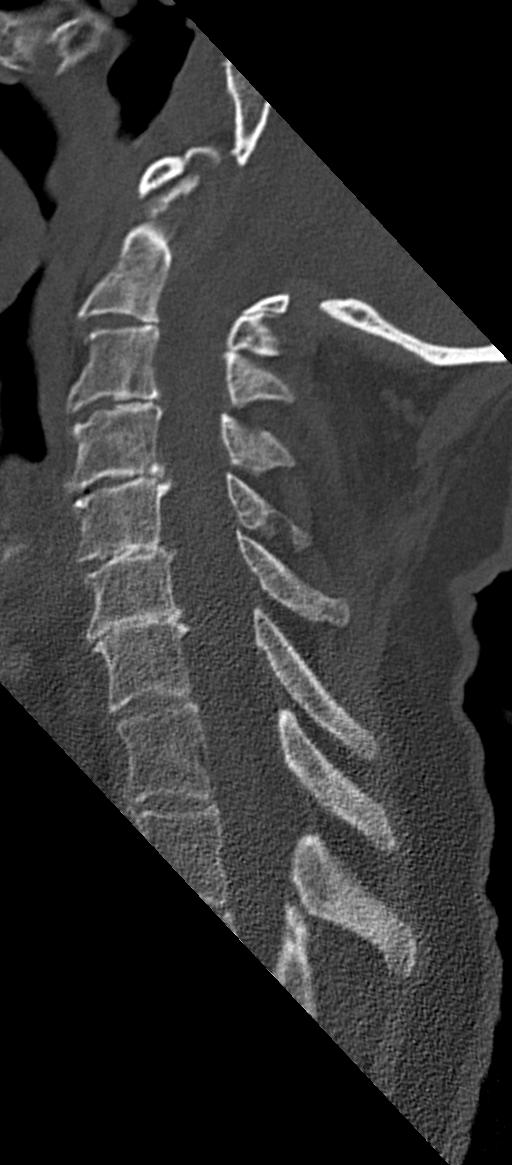
[im 40/69  bone]
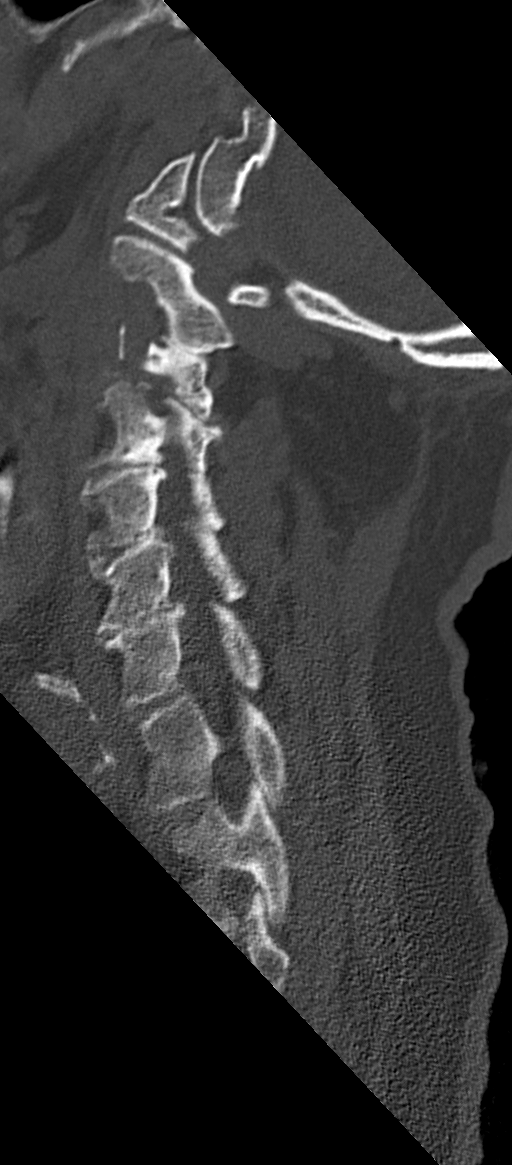
[im 46/69  bone]
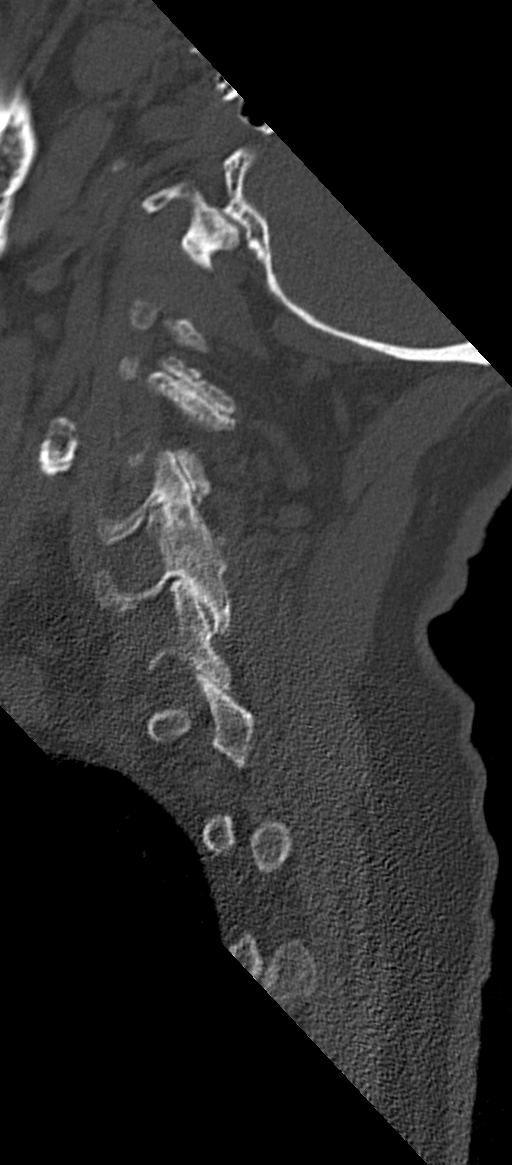

[9 of 29 positions shown; findings below may reference images not displayed]

FINDINGS: CT HEAD FINDINGS

Brain: No evidence of acute infarction, hemorrhage, hydrocephalus,
extra-axial collection or mass lesion/mass effect. Atrophy and
chronic microvascular ischemic changes are noted bilaterally.

Vascular: No hyperdense vessel or unexpected calcification.

Skull: There is right frontal scalp swelling without evidence for an
underlying calvarial fracture.

Sinuses/Orbits: No acute finding.

Other: None.

CT CERVICAL SPINE FINDINGS

Alignment: There is straightening of the normal cervical lordotic
curvature.

Skull base and vertebrae: No acute fracture. No primary bone lesion
or focal pathologic process.

Soft tissues and spinal canal: No prevertebral fluid or swelling. No
visible canal hematoma.

Disc levels: Multilevel degenerative changes are noted throughout
the cervical spine.

Upper chest: Negative.

Other: There is a mass centered in the right parotid gland measuring
approximately 2.9 x 1.7 cm. This mass appears to contain both cystic
and solid components.
IMPRESSION: 1. No CT evidence for acute intracranial pathology.
2. Right frontal scalp swelling without evidence for underlying
calvarial fracture.
3. No evidence for acute fracture of the cervical spine.
4. Right parotid gland mass measuring approximately 2.9 cm. This is
amenable to percutaneous biopsy as clinically indicated.
